# Patient Record
Sex: Male | Born: 1990 | Race: Black or African American | Hispanic: No | Marital: Single | Smoking: Current every day smoker
Health system: Southern US, Community
[De-identification: ages and names within clinical notes are randomized; demographics above are authoritative.]

## PROBLEM LIST (undated history)

## (undated) DIAGNOSIS — J45909 Unspecified asthma, uncomplicated: Secondary | ICD-10-CM

---
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
## 2010-03-30 ENCOUNTER — Ambulatory Visit (HOSPITAL_COMMUNITY): Admission: RE | Admit: 2010-03-30 | Discharge: 2010-03-30 | Payer: Self-pay | Admitting: Family Medicine

## 2011-10-02 IMAGING — CR DG CLAVICLE*R*
2 series · 2 of 2 positions shown · non-contrast
Comparison: None

CLINICAL DATA: Clavicular injury, pain

RIGHT CLAVICLE - 2+ VIEWS

[view not recorded (1 of 2)]
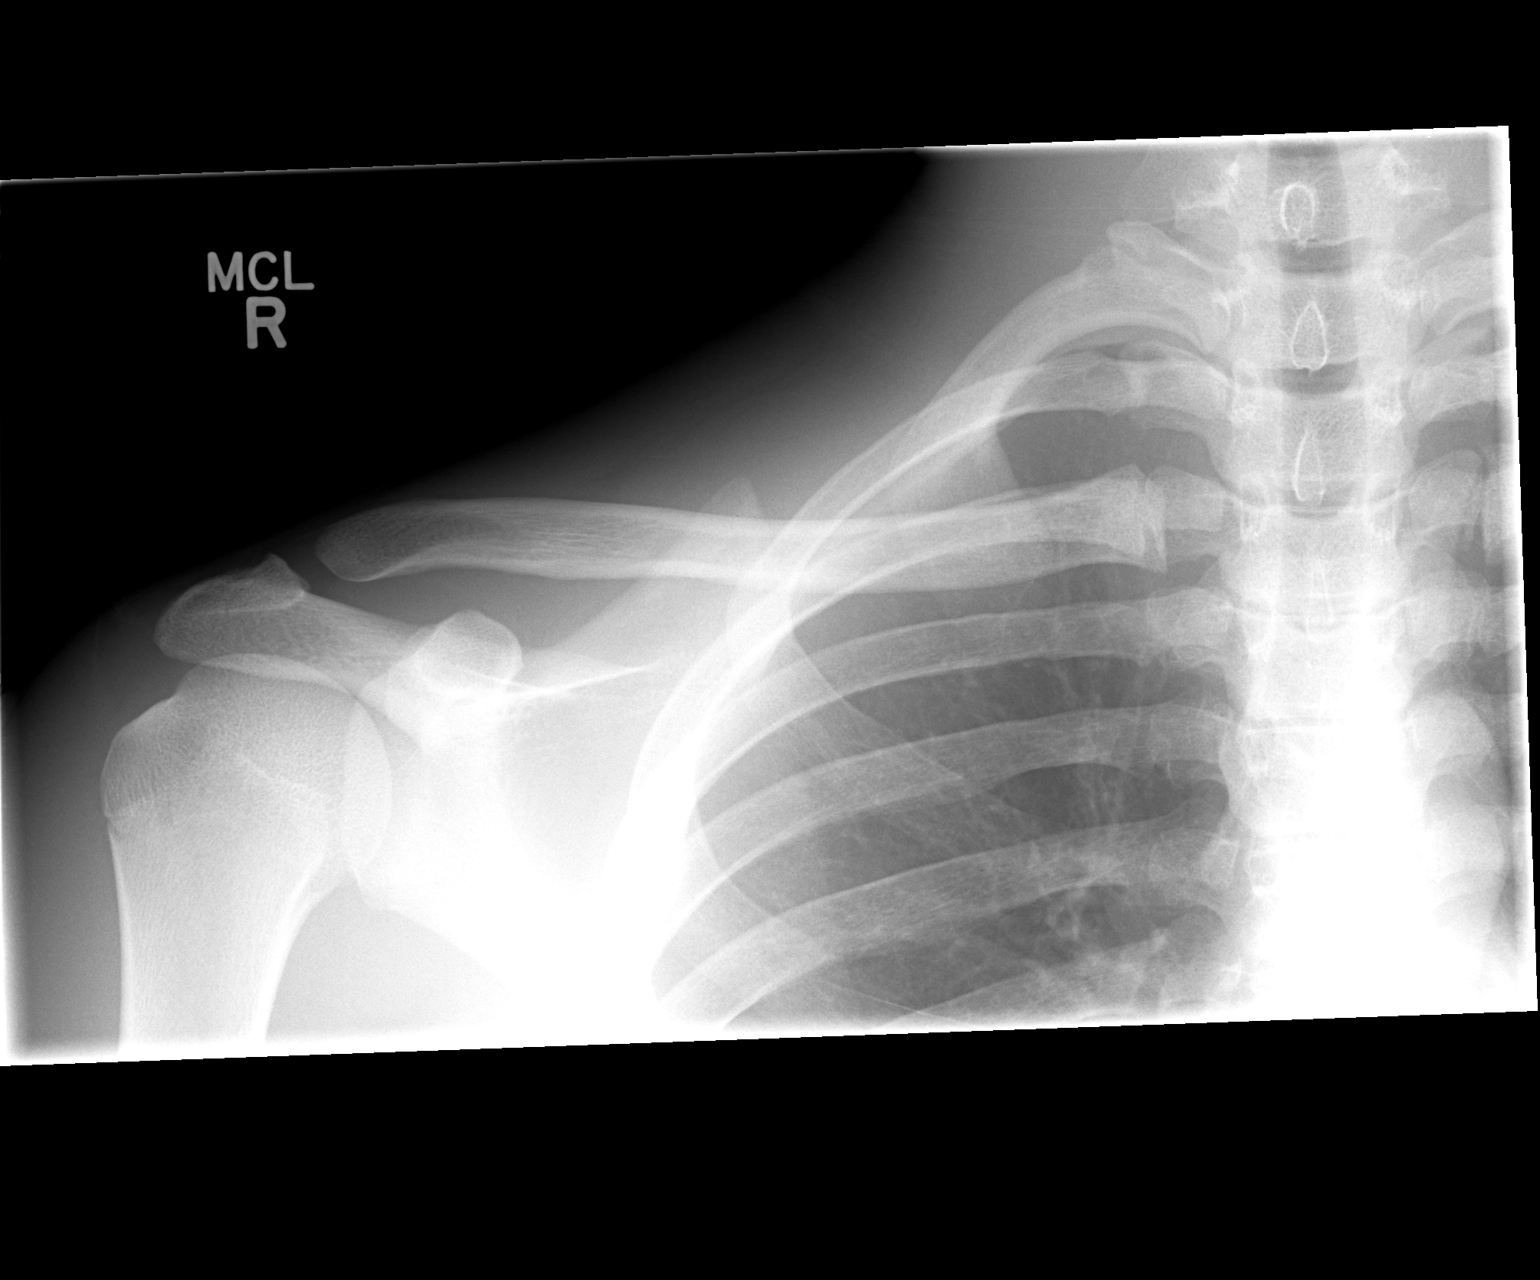

[view not recorded (2 of 2)]
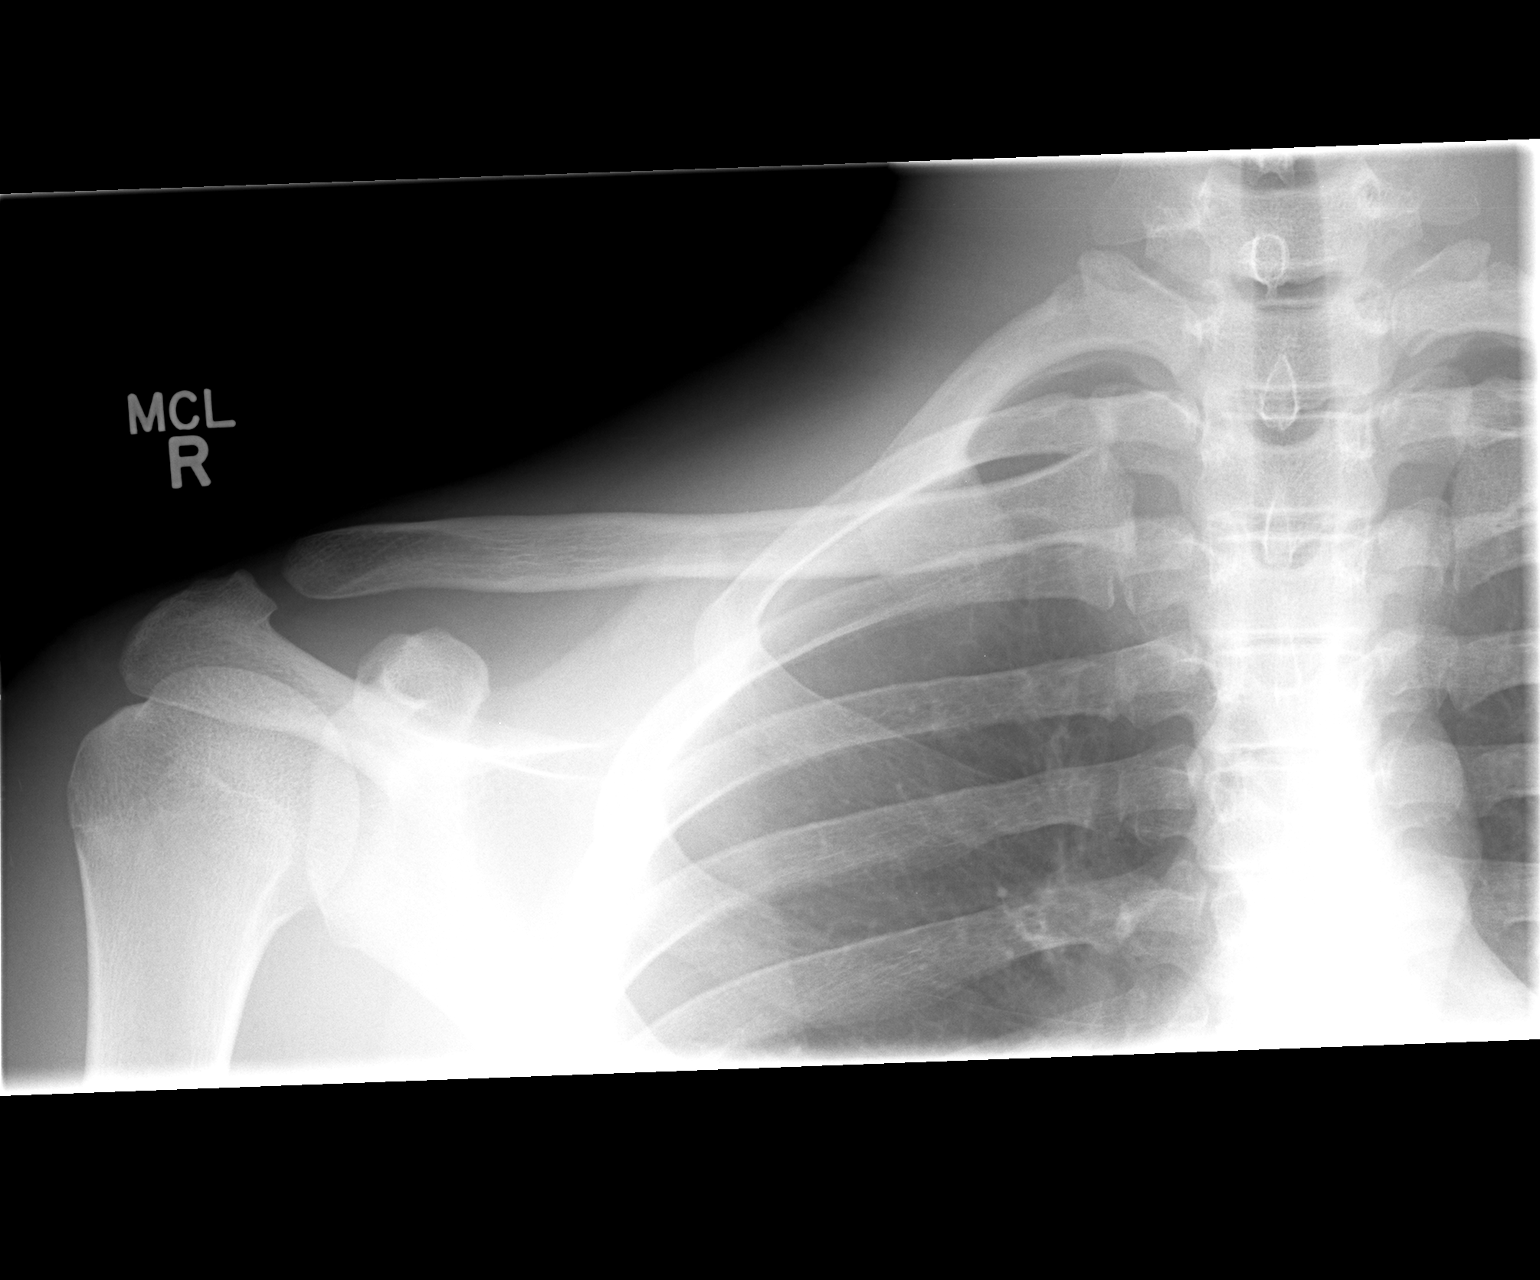

[2 of 2 positions shown; findings below may reference images not displayed]

FINDINGS: Sternoclavicular and acromioclavicular joint alignments normal.
Mineralization normal.
No acute fracture, dislocation, or bone destruction.
IMPRESSION: No acute right clavicular abnormalities.

## 2012-07-28 ENCOUNTER — Emergency Department (HOSPITAL_COMMUNITY)
Admission: EM | Admit: 2012-07-28 | Discharge: 2012-07-28 | Disposition: A | Discharge status: Home / Self Care | Payer: Self-pay | Attending: Emergency Medicine | Admitting: Emergency Medicine

## 2012-07-28 ENCOUNTER — Encounter (HOSPITAL_COMMUNITY): Payer: Self-pay

## 2012-07-28 DIAGNOSIS — R51 Headache: Secondary | ICD-10-CM | POA: Insufficient documentation

## 2012-07-28 DIAGNOSIS — B9789 Other viral agents as the cause of diseases classified elsewhere: Secondary | ICD-10-CM | POA: Insufficient documentation

## 2012-07-28 DIAGNOSIS — J45909 Unspecified asthma, uncomplicated: Secondary | ICD-10-CM | POA: Insufficient documentation

## 2012-07-28 DIAGNOSIS — B349 Viral infection, unspecified: Secondary | ICD-10-CM

## 2012-07-28 DIAGNOSIS — IMO0001 Reserved for inherently not codable concepts without codable children: Secondary | ICD-10-CM | POA: Insufficient documentation

## 2012-07-28 DIAGNOSIS — R6883 Chills (without fever): Secondary | ICD-10-CM | POA: Insufficient documentation

## 2012-07-28 HISTORY — DX: Unspecified asthma, uncomplicated: J45.909

## 2012-07-28 LAB — URINE MICROSCOPIC-ADD ON

## 2012-07-28 LAB — COMPREHENSIVE METABOLIC PANEL
ALT: 15 U/L (ref 0–53)
AST: 18 U/L (ref 0–37)
CO2: 31 mEq/L (ref 19–32)
Calcium: 9.3 mg/dL (ref 8.4–10.5)
GFR calc non Af Amer: 81 mL/min — ABNORMAL LOW (ref >90)
Sodium: 139 mEq/L (ref 135–145)
Total Protein: 7.4 g/dL (ref 6.0–8.3)

## 2012-07-28 LAB — URINALYSIS, ROUTINE W REFLEX MICROSCOPIC
Bilirubin Urine: NEGATIVE
Hgb urine dipstick: NEGATIVE
Specific Gravity, Urine: 1.025 (ref 1.005–1.030)
pH: 6 (ref 5.0–8.0)

## 2012-07-28 MED ORDER — ONDANSETRON HCL 4 MG PO TABS: 4.0000 mg | ORAL_TABLET | Freq: Four times a day (QID) | ORAL | Status: DC

## 2012-07-28 MED ORDER — SODIUM CHLORIDE 0.9 % IV SOLN
1000.0000 mL | INTRAVENOUS | Status: DC
  Administered 2012-07-28: 1000 mL via INTRAVENOUS

## 2012-07-28 MED ORDER — ONDANSETRON HCL 4 MG/2ML IJ SOLN
4.0000 mg | Freq: Once | INTRAMUSCULAR | Status: AC
  Administered 2012-07-28: 4 mg via INTRAVENOUS
  Filled 2012-07-28: qty 2

## 2012-07-28 MED ORDER — SODIUM CHLORIDE 0.9 % IV SOLN
1000.0000 mL | Freq: Once | INTRAVENOUS | Status: AC
  Administered 2012-07-28: 1000 mL via INTRAVENOUS

## 2012-07-28 NOTE — ED Provider Notes (Signed)
History     CSN: [id number]  Arrival date & time 07/28/12  4696   First MD Initiated Contact with Patient 07/28/12 1002      Chief Complaint  Patient presents with  . Emesis  . Headache    (Consider location/radiation/quality/duration/timing/severity/associated sxs/prior treatment) Patient is a 22 y.o. male presenting with vomiting and headaches. The history is provided by the patient.  Emesis Severity:  Moderate Duration:  4 days Timing:  Intermittent Quality:  Stomach contents Able to tolerate:  Liquids Progression:  Unchanged Chronicity:  New Recent urination:  Normal Relieved by:  Nothing Worsened by:  Nothing tried Ineffective treatments:  None tried Associated symptoms: chills, headaches and myalgias   Associated symptoms: no abdominal pain, no arthralgias and no fever   Risk factors: no alcohol use, no diabetes and no suspect food intake   Headache Associated symptoms: myalgias and vomiting   Associated symptoms: no abdominal pain, no back pain, no cough, no dizziness, no neck pain, no photophobia and no seizures     Past Medical History  Diagnosis Date  . Asthma     History reviewed. No pertinent past surgical history.  No family history on file.  History  Substance Use Topics  . Smoking status: Never Smoker   . Smokeless tobacco: Not on file  . Alcohol Use: No      Review of Systems  Constitutional: Positive for chills. Negative for activity change.       All ROS Neg except as noted in HPI  HENT: Negative for nosebleeds and neck pain.   Eyes: Negative for photophobia and discharge.  Respiratory: Negative for cough, shortness of breath and wheezing.   Cardiovascular: Negative for chest pain and palpitations.  Gastrointestinal: Positive for vomiting. Negative for abdominal pain and blood in stool.  Genitourinary: Negative for dysuria, frequency and hematuria.  Musculoskeletal: Positive for myalgias. Negative for back pain and arthralgias.  Skin:  Negative.   Neurological: Positive for headaches. Negative for dizziness, seizures and speech difficulty.  Psychiatric/Behavioral: Negative for hallucinations and confusion.    Allergies  Review of patient's allergies indicates no known allergies.  Home Medications  No current outpatient prescriptions on file.  BP 110/78  Pulse 71  Temp(Src) 97.8 F (36.6 C) (Oral)  Resp 16  Ht 5\' 10"  (1.778 m)  Wt 140 lb (63.504 kg)  BMI 20.09 kg/m2  SpO2 99%  Physical Exam  Nursing note and vitals reviewed. Constitutional: He is oriented to person, place, and time. He appears well-developed and well-nourished.  Non-toxic appearance.  HENT:  Head: Normocephalic.  Right Ear: Tympanic membrane and external ear normal.  Left Ear: Tympanic membrane and external ear normal.  Eyes: EOM and lids are normal. Pupils are equal, round, and reactive to light.  Neck: Normal range of motion. Neck supple. Carotid bruit is not present.  Cardiovascular: Normal rate, regular rhythm, normal heart sounds, intact distal pulses and normal pulses.   Pulmonary/Chest: Breath sounds normal. No respiratory distress.  Abdominal: Soft. Bowel sounds are normal. There is no tenderness. There is no guarding.  Musculoskeletal: Normal range of motion.  Lymphadenopathy:       Head (right side): No submandibular adenopathy present.       Head (left side): No submandibular adenopathy present.    He has no cervical adenopathy.  Neurological: He is alert and oriented to person, place, and time. He has normal strength. No cranial nerve deficit or sensory deficit. He exhibits normal muscle tone. Coordination normal.  Skin: Skin  is warm and dry.  Psychiatric: He has a normal mood and affect. His speech is normal.    ED Course  Procedures (including critical care time)  Labs Reviewed  COMPREHENSIVE METABOLIC PANEL - Abnormal; Notable for the following:    GFR calc non Af Amer 81 (*)    All other components within normal  limits  URINALYSIS, ROUTINE W REFLEX MICROSCOPIC - Abnormal; Notable for the following:    Protein, ur TRACE (*)    All other components within normal limits  LIPASE, BLOOD  URINE MICROSCOPIC-ADD ON   No results found.   No diagnosis found.    MDM  I have reviewed nursing notes, vital signs, and all appropriate lab and imaging results for this patient.   No vomiting noted while the patient is in the emergency department. The comprehensive metabolic panel was completely normal. The lipase is normal. The urinalysis is also normal. Suspect the patient has a viral illness with nausea vomiting. Plan the patient will be given a prescription for Zofran, and asked to increase fluids. The patient also strongly advised to wash hands frequently. He is to return if not improving       Kathie Dike, [place] 07/28/12 1219

## 2012-07-28 NOTE — ED Provider Notes (Signed)
Medical screening examination/treatment/procedure(s) were performed by non-physician practitioner and as supervising physician I was immediately available for consultation/collaboration.   Shamir Tuzzolino, MD 07/28/12 1551 

## 2012-07-28 NOTE — ED Notes (Signed)
Pt reports he is feeling better. No vomiting since he has been here.

## 2012-07-28 NOTE — ED Notes (Signed)
Pt c/o vomiting and headache since Friday.   Denies diarrhea, LBM was yesterday.  Denies abd pain.

## 2013-05-01 ENCOUNTER — Encounter (HOSPITAL_COMMUNITY): Payer: Self-pay | Admitting: Emergency Medicine

## 2013-05-01 ENCOUNTER — Emergency Department (HOSPITAL_COMMUNITY)
Admission: EM | Admit: 2013-05-01 | Discharge: 2013-05-01 | Disposition: A | Discharge status: Home / Self Care | Payer: Self-pay | Attending: Emergency Medicine | Admitting: Emergency Medicine

## 2013-05-01 DIAGNOSIS — F17200 Nicotine dependence, unspecified, uncomplicated: Secondary | ICD-10-CM | POA: Insufficient documentation

## 2013-05-01 DIAGNOSIS — R5381 Other malaise: Secondary | ICD-10-CM | POA: Insufficient documentation

## 2013-05-01 DIAGNOSIS — J45909 Unspecified asthma, uncomplicated: Secondary | ICD-10-CM | POA: Insufficient documentation

## 2013-05-01 DIAGNOSIS — E860 Dehydration: Secondary | ICD-10-CM | POA: Insufficient documentation

## 2013-05-01 DIAGNOSIS — M6282 Rhabdomyolysis: Secondary | ICD-10-CM | POA: Insufficient documentation

## 2013-05-01 DIAGNOSIS — R112 Nausea with vomiting, unspecified: Secondary | ICD-10-CM | POA: Insufficient documentation

## 2013-05-01 DIAGNOSIS — R34 Anuria and oliguria: Secondary | ICD-10-CM | POA: Insufficient documentation

## 2013-05-01 LAB — CBC WITH DIFFERENTIAL/PLATELET
HCT: 47.8 % (ref 39.0–52.0)
Hemoglobin: 15.8 g/dL (ref 13.0–17.0)
Lymphocytes Relative: 5 % — ABNORMAL LOW (ref 12–46)
Monocytes Absolute: 0.5 10*3/uL (ref 0.1–1.0)
Monocytes Relative: 4 % (ref 3–12)
Neutro Abs: 12.3 10*3/uL — ABNORMAL HIGH (ref 1.7–7.7)
RBC: 5.5 MIL/uL (ref 4.22–5.81)
WBC: 13.6 10*3/uL — ABNORMAL HIGH (ref 4.0–10.5)

## 2013-05-01 LAB — BASIC METABOLIC PANEL
BUN: 24 mg/dL — ABNORMAL HIGH (ref 6–23)
CO2: 23 mEq/L (ref 19–32)
Chloride: 98 mEq/L (ref 96–112)
Creatinine, Ser: 1.32 mg/dL (ref 0.50–1.35)

## 2013-05-01 LAB — URINALYSIS, ROUTINE W REFLEX MICROSCOPIC
Glucose, UA: NEGATIVE mg/dL
Ketones, ur: >80 mg/dL — AB
Leukocytes, UA: NEGATIVE
Specific Gravity, Urine: 1.025 (ref 1.005–1.030)
pH: 6 (ref 5.0–8.0)

## 2013-05-01 MED ORDER — ONDANSETRON HCL 4 MG/2ML IJ SOLN
4.0000 mg | Freq: Once | INTRAMUSCULAR | Status: AC
  Administered 2013-05-01: 4 mg via INTRAVENOUS
  Filled 2013-05-01: qty 2

## 2013-05-01 MED ORDER — SODIUM CHLORIDE 0.9 % IV SOLN
Freq: Once | INTRAVENOUS | Status: AC
  Administered 2013-05-01: 1000 mL via INTRAVENOUS

## 2013-05-01 MED ORDER — ONDANSETRON HCL 4 MG/2ML IJ SOLN: 4.0000 mg | Freq: Once | INTRAMUSCULAR | Status: DC

## 2013-05-01 MED ORDER — METOCLOPRAMIDE HCL 5 MG/ML IJ SOLN
10.0000 mg | Freq: Once | INTRAMUSCULAR | Status: AC
  Administered 2013-05-01: 10 mg via INTRAVENOUS
  Filled 2013-05-01: qty 2

## 2013-05-01 NOTE — ED Provider Notes (Signed)
CSN: [id number]     Arrival date & time 05/01/13  1839 History   First MD Initiated Contact with Patient 05/01/13 1912     Chief Complaint  Patient presents with  . Nausea   (Consider location/radiation/quality/duration/timing/severity/associated sxs/prior Treatment) HPI Comments: Fred Woodward is a 22 y.o. male who presents to the Emergency Department complaining of generalized weakness, leg cramps, nausea and vomiting that began this afternoon after playing football.  States that he believes he may have gotten dehydrated because he did not drink any fluids.  He later tried to drink some water and ginger ale , but vomited.  He denies alcohol or drug use, abdominal pain, chest pain or shortness of breath.  He states that nothing makes he symptoms better.    The history is provided by the patient.    Past Medical History  Diagnosis Date  . Asthma    History reviewed. No pertinent past surgical history. No family history on file. History  Substance Use Topics  . Smoking status: Current Every Day Smoker    Types: Cigarettes  . Smokeless tobacco: Not on file  . Alcohol Use: Yes     Comment: 04/12/13    Review of Systems  Constitutional: Negative for fever, chills, activity change and appetite change.  HENT: Negative for trouble swallowing.   Eyes: Negative for visual disturbance.  Respiratory: Negative for cough, chest tightness and shortness of breath.   Cardiovascular: Negative for chest pain.  Gastrointestinal: Positive for nausea and vomiting. Negative for abdominal pain and constipation.  Genitourinary: Positive for decreased urine volume. Negative for dysuria, flank pain and difficulty urinating.  Musculoskeletal: Positive for myalgias. Negative for arthralgias and joint swelling.       Cramping of his upper and lower legs  Skin: Negative for color change and wound.  Neurological: Positive for weakness. Negative for dizziness, syncope, speech difficulty, light-headedness,  numbness and headaches.  Hematological: Negative for adenopathy.  Psychiatric/Behavioral: Negative for confusion.  All other systems reviewed and are negative.    Allergies  Review of patient's allergies indicates no known allergies.  Home Medications  No current outpatient prescriptions on file. BP 154/88  Pulse 51  Temp(Src) 97.2 F (36.2 C) (Oral)  Resp 18  Ht 5\' 10"  (1.778 m)  Wt 125 lb 7 oz (56.898 kg)  BMI 18.00 kg/m2  SpO2 100% Physical Exam  Nursing note and vitals reviewed. Constitutional: He is oriented to person, place, and time. He appears well-developed and well-nourished. No distress.  HENT:  Head: Normocephalic and atraumatic.  Mouth/Throat: Uvula is midline and oropharynx is clear and moist. Mucous membranes are dry.  Eyes: Conjunctivae and EOM are normal. Pupils are equal, round, and reactive to light.  Neck: Normal range of motion. Neck supple.  Cardiovascular: Normal rate, normal heart sounds and intact distal pulses.   No murmur heard. Pulmonary/Chest: Effort normal and breath sounds normal. No respiratory distress. He has no wheezes. He has no rales. He exhibits no tenderness.  Abdominal: Soft. He exhibits no distension and no mass. There is no tenderness. There is no guarding.  Musculoskeletal: Normal range of motion. He exhibits no edema and no tenderness.  Neurological: He is alert and oriented to person, place, and time. No sensory deficit. He exhibits normal muscle tone. Coordination normal.  Skin: Skin is warm and dry. No rash noted.  Psychiatric: He has a normal mood and affect. His behavior is normal.    ED Course  Procedures (including critical care time) Labs Review  Labs Reviewed  CBC WITH DIFFERENTIAL - Abnormal; Notable for the following:    WBC 13.6 (*)    Platelets 145 (*)    Neutrophils Relative % 91 (*)    Neutro Abs 12.3 (*)    Lymphocytes Relative 5 (*)    All other components within normal limits  BASIC METABOLIC PANEL -  Abnormal; Notable for the following:    Glucose, Bld 107 (*)    BUN 24 (*)    Calcium 11.2 (*)    GFR calc non Af Amer 75 (*)    GFR calc Af Amer 87 (*)    All other components within normal limits  URINALYSIS, ROUTINE W REFLEX MICROSCOPIC - Abnormal; Notable for the following:    Ketones, ur >80 (*)    All other components within normal limits  CK - Abnormal; Notable for the following:    Total CK 933 (*)    All other components within normal limits   Imaging Review No results found.  EKG Interpretation   None       MDM  Patient is watching TV, appears mildly uncomfortable.  Non-toxic appearing.    Patient with probable dehydration and early rhabdomyolysis.  Will give IV fluids, zofran and check labs.    Labs reviewed and discussed with EDP.  Will repeat liter of fluid and discuss possible admission with patient.    2200 patient is feeling better, has urinated, ambulated to restroom, drank fluids and received 2L IVF's and states he is feeling much better and requesting discharge.  I have discussed admission, but patient prefers to go home, drink fluids and return here tomorrow for recheck of labs.  Patient's father also at bedside and states that he will bring him back tomorrow.  I have discussed the importance of close recheck and risks involved if he does not drink adequate fluids.  Pt verbalizes understanding and agrees to return tomorrow or sooner if needed.    Dorothia Passmore L. Trisha Mangle, PA-C 05/01/13 2302

## 2013-05-01 NOTE — ED Notes (Signed)
Pt is unable to urinate at this  time requesting something else to drink. Fred Woodward

## 2013-05-01 NOTE — ED Notes (Signed)
Pt stated that he was playing football today and thinks he may have gotten dehydrated. Is weak  And having nausea. Legs are cramping.

## 2013-05-02 NOTE — ED Provider Notes (Signed)
Medical screening examination/treatment/procedure(s) were performed by non-physician practitioner and as supervising physician I was immediately available for consultation/collaboration.  EKG Interpretation   None        Suki Crockett R. Cloma Rahrig, MD 05/02/13 2359 

## 2013-07-28 ENCOUNTER — Emergency Department (HOSPITAL_COMMUNITY)
Admission: EM | Admit: 2013-07-28 | Discharge: 2013-07-28 | Disposition: A | Discharge status: Home / Self Care | Payer: Self-pay | Attending: Emergency Medicine | Admitting: Emergency Medicine

## 2013-07-28 ENCOUNTER — Encounter (HOSPITAL_COMMUNITY): Payer: Self-pay | Admitting: Emergency Medicine

## 2013-07-28 DIAGNOSIS — F17200 Nicotine dependence, unspecified, uncomplicated: Secondary | ICD-10-CM | POA: Insufficient documentation

## 2013-07-28 DIAGNOSIS — J45909 Unspecified asthma, uncomplicated: Secondary | ICD-10-CM | POA: Insufficient documentation

## 2013-07-28 DIAGNOSIS — R112 Nausea with vomiting, unspecified: Secondary | ICD-10-CM | POA: Insufficient documentation

## 2013-07-28 LAB — CBC WITH DIFFERENTIAL/PLATELET
BASOS ABS: 0 10*3/uL (ref 0.0–0.1)
Basophils Relative: 1 % (ref 0–1)
EOS ABS: 0 10*3/uL (ref 0.0–0.7)
Eosinophils Relative: 0 % (ref 0–5)
HEMATOCRIT: 44.9 % (ref 39.0–52.0)
Hemoglobin: 14.8 g/dL (ref 13.0–17.0)
LYMPHS ABS: 0.7 10*3/uL (ref 0.7–4.0)
Lymphocytes Relative: 22 % (ref 12–46)
MCH: 28.5 pg (ref 26.0–34.0)
MCHC: 33 g/dL (ref 30.0–36.0)
MCV: 86.5 fL (ref 78.0–100.0)
Monocytes Absolute: 0.9 10*3/uL (ref 0.1–1.0)
Monocytes Relative: 25 % — ABNORMAL HIGH (ref 3–12)
NEUTROS ABS: 1.8 10*3/uL (ref 1.7–7.7)
Neutrophils Relative %: 52 % (ref 43–77)
PLATELETS: 110 10*3/uL — AB (ref 150–400)
RBC: 5.19 MIL/uL (ref 4.22–5.81)
RDW: 12.3 % (ref 11.5–15.5)
WBC: 3.4 10*3/uL — ABNORMAL LOW (ref 4.0–10.5)

## 2013-07-28 LAB — COMPREHENSIVE METABOLIC PANEL
ALBUMIN: 4.6 g/dL (ref 3.5–5.2)
ALT: 29 U/L (ref 0–53)
AST: 28 U/L (ref 0–37)
Alkaline Phosphatase: 64 U/L (ref 39–117)
BUN: 21 mg/dL (ref 6–23)
CALCIUM: 9.9 mg/dL (ref 8.4–10.5)
CO2: 30 mEq/L (ref 19–32)
CREATININE: 1.22 mg/dL (ref 0.50–1.35)
Chloride: 98 mEq/L (ref 96–112)
GFR calc Af Amer: >90 mL/min (ref >90)
GFR calc non Af Amer: 83 mL/min — ABNORMAL LOW (ref >90)
Glucose, Bld: 102 mg/dL — ABNORMAL HIGH (ref 70–99)
Potassium: 3.9 mEq/L (ref 3.7–5.3)
Sodium: 141 mEq/L (ref 137–147)
TOTAL PROTEIN: 8.6 g/dL — AB (ref 6.0–8.3)
Total Bilirubin: 0.5 mg/dL (ref 0.3–1.2)

## 2013-07-28 LAB — LIPASE, BLOOD: LIPASE: 21 U/L (ref 11–59)

## 2013-07-28 MED ORDER — ONDANSETRON HCL 4 MG PO TABS: 4.0000 mg | ORAL_TABLET | Freq: Three times a day (TID) | ORAL | Status: DC | PRN

## 2013-07-28 MED ORDER — ONDANSETRON 8 MG PO TBDP
8.0000 mg | ORAL_TABLET | Freq: Once | ORAL | Status: AC
  Administered 2013-07-28: 8 mg via ORAL
  Filled 2013-07-28: qty 1

## 2013-07-28 NOTE — ED Notes (Signed)
Pt called to come back to pick up Rx. Left at front desk for pt

## 2013-07-28 NOTE — ED Notes (Signed)
Pt states his mother and brother has a virus. Complain of vomiting. States last time was about two hours ago. Denies other symptoms

## 2013-07-28 NOTE — Discharge Instructions (Signed)
°Emergency Department Resource Guide °1) Find a Doctor and Pay Out of Pocket °Although you won't have to find out who is covered by your insurance plan, it is a good idea to ask around and get recommendations. You will then need to call the office and see if the doctor you have chosen will accept you as a new patient and what types of options they offer for patients who are self-pay. Some doctors offer discounts or will set up payment plans for their patients who do not have insurance, but you will need to ask so you aren't surprised when you get to your appointment. ° °2) Contact Your Local Health Department °Not all health departments have doctors that can see patients for sick visits, but many do, so it is worth a call to see if yours does. If you don't know where your local health department is, you can check in your phone book. The CDC also has a tool to help you locate your state's health department, and many state websites also have listings of all of their local health departments. ° °3) Find a Walk-in Clinic °If your illness is not likely to be very severe or complicated, you may want to try a walk in clinic. These are popping up all over the country in pharmacies, drugstores, and shopping centers. They're usually staffed by nurse practitioners or physician assistants that have been trained to treat common illnesses and complaints. They're usually fairly quick and inexpensive. However, if you have serious medical issues or chronic medical problems, these are probably not your best option. ° °No Primary Care Doctor: °- Call [facility] Connect at  832-8000 - they can help you locate a primary care doctor that  accepts your insurance, provides certain services, etc. °- Physician Referral Service- 1-800-533-3463 ° °Chronic Pain Problems: °Organization         Address  Phone   Notes  °[facility]  (336) 297-2271 Patients need to be referred by their primary care doctor.  ° °Medication  Assistance: °Organization         Address  Phone   Notes  °[place] [facility] [address] °[place], [place] [address] (336) 641-8030 --Must be a resident of [place] °-- Must have NO insurance coverage whatsoever (no Medicaid/ Medicare, etc.) °-- The pt. MUST have a primary care doctor that directs their care regularly and follows them in the community °  °[facility]  (866) 331-1348   °[employer]  (888) 892-1162   ° °Agencies that provide inexpensive medical care: °Organization         Address  Phone   Notes  °[facility]  (336) 832-8035   °[facility]    (336) 832-7272   °[facility] [address] °[place], [place] [address] (336) 832-4777   °[facility] [address], °[place] (336) 271-4999   °[facility]    (336) 373-0678   °[facility]    (336) 272-1050   °[facility] ° [address], [place] Phone:  (336) 832-4444, Fax:  (336) 832-4440 Hours of Operation:  9 am - 6 pm, M-F.  Also accepts Medicaid/Medicare and self-pay.  °[facility] ° [address], [place] Phone: (336) 832-3150, Fax: (336) [phone]. Hours of Operation:  8:30 am - 5:30 pm, M-F.  Also accepts Medicaid and self-pay.  °[facility] [place] [address], [place] Phone: (336) 878-6027   °[facility] [address], [place], [place] (336)723-1848, Ext. 123 Mondays & Thursdays: 7-9 AM.  First 15 patients are seen on a first come, first serve basis. °  ° °Medicaid-accepting [place] Providers: ° °Organization         Address  Phone   Notes  °[facility] [address], [place] (336) 641-2100 Also accepts self-pay patients.  °[facility] [address], [place] ° (336) 856-9996   °[facility] [address], [place]  (336) 288-8857   °[facility] [address] [place] [address], [place] (336) 299-7000   °Veita Bland [address], [place]  ° (336) 373-1557 Only accepts [place] Access Medicaid patients after they have their name applied to their card.  ° °Self-Pay (no insurance) in [place]: ° °Organization         Address  Phone   Notes  °Sickle Cell Patients, [facility] [address], [place] (336) 832-1970   °[facility] [address], [place] (336) 832-4400   °[facility] ° [address], [place] (336) 992-4800   °[facility]/Dr. Osei-Bonsu ° [address] [place] [address], [place] or [address], [place] (336) [phone] Phone number for both [place] and [place] locations is the same.  °[facility] [address], [place] (336) 299-0000   °[facility] [address] [place] [address], [place] or [address] (336) 852-7530 °(336) 878-2260   °[facility] [address], [place] (336) 350-1642, phone; (336) 294-5005, fax Sees patients 1st and 3rd Saturday of every month.  Must not qualify for public or private insurance (i.e. Medicaid, Medicare, [place] Health Choice, Veterans' Benefits) • Household income should be no more than 200% of the poverty level •The clinic cannot treat you if you are pregnant or think you are pregnant • Sexually transmitted diseases are not treated at the clinic.  ° ° °Dental Care: °Organization         Address  Phone  Notes  °[place] [facility] [address], [place] (336) 641-6152 Accepts children up to age 21 who are enrolled in Medicaid or [place] Health Choice; pregnant women with a Medicaid card; and children who have applied for Medicaid or [place] Health Choice, but were declined, whose parents can pay a reduced fee at time of service.  °[place]  [facility] [place]  [address], [place] (336) 641-7733 Accepts children up to age 21 who are enrolled in Medicaid or [place] Health Choice; pregnant women with a Medicaid card; and children who have applied for Medicaid or [place] Health Choice, but were declined, whose parents can pay a reduced fee at time of service.  °[facility] ° [address], [place] (336) 641-4533 Patients are seen by appointment only. Walk-ins are not accepted. [facility] will see patients 18 years of age and older. °Monday - Tuesday (8am-5pm) °Most Wednesdays (8:30-5pm) °$30 per visit, cash only  °[facility] ° [address], [place] (336) 641-4533 Patients are seen by appointment only. Walk-ins are not accepted. [facility] will see patients 18 years of age and older. °One   Wednesday Evening (Monthly: Volunteer Based).  $30 per visit, cash only  °[facility]  (919) 537-3737 for adults; Children under age 4, call Graduate Pediatric Dentistry at ([phone]) [phone]. Children aged 4-14, please call (919) [phone] to request a pediatric application. ° Dental services are provided in all areas of dental care including fillings, crowns and bridges, complete and partial dentures, implants, gum treatment, root canals, and extractions. Preventive care is also provided. Treatment is provided to both adults and children. °Patients are selected via a lottery and there is often a waiting list. °  °[facility] [address], °[place] ° (336) 763-8833 [email or web address] °  °[facility] [address], [place], [place] (336)723-1848, Ext. 123 Second and Fourth Thursday of each month, opens at 6:30 AM; Clinic ends at 9 AM.  Patients are seen on a first-come first-served basis, and a limited number are seen during each clinic.  ° °[facility] ° [address], [place], [place] (336) 723-7904    Eligibility Requirements °You must have lived in [place], [place], or [place] counties for at least the last three months. °  You cannot be eligible for state or federal sponsored healthcare insurance, including Veterans Administration, Medicaid, or Medicare. °  You generally cannot be eligible for healthcare insurance through your employer.  °  How to apply: °Eligibility screenings are held every Tuesday and Wednesday afternoon from 1:00 pm until 4:00 pm. You do not need an appointment for the interview!  °[facility] [address], [place], [place] 336-631-2330   °[facility]  336-342-8273   °[facility]  336-703-3100   °[facility]  336-570-6415   ° °Behavioral Health Resources in the Community: °Intensive Outpatient Programs °Organization         Address  Phone  Notes  °[facility] [address], [place], [place] 336-878-6098   °[facility] [address], [place], [place] 336-832-9800   °[facility] [address], [place], [place] ° 336-882-2125   °[facility] [address],  °[place], [place] 1-800-853-5163 or 336-641-4981   °Substance Abuse Resources °Organization         Address  Phone  Notes  °[facility]  336-882-2125   °[facility]  336-784-9470   °[facility]  336-285-9073   °[facility]  336-845-3988   °Residential & Outpatient Substance Abuse Program  1-800-659-3381   °Psychological Services °Organization         Address  Phone  Notes  °[facility]  336- 832-9600   °[facility]  336- 378-7881   °[facility] [address], [place] 1-800-853-5163 or 336-641-4981   ° °Mobile Crisis Teams °Organization         Address  Phone  Notes  °[facility], Mobile Crisis Care Unit  1-877-626-1772   °Assertive °[facility] ° [address]  [place], [place] 336-834-9664   °Sharon DeEsch [address] °[place] 336-554-5454   ° °Self-Help/Support Groups °Organization         Address  Phone             Notes  °[facility] of  - variety of support groups  336- 373-1402 Call for more information  °Narcotics Anonymous (NA), [facility] [address], °[place]  2 meetings at this location  ° °  Residential Treatment Programs Organization         Address  Phone  Notes  [facility] [address],    [place]  [phone]   [facility]  [address], [place] [phone], [place], [place] [phone]   [facility] [address], [place] [phone] Admissions: 8am-3pm M-F  [facility] [address]    [place], [place] [phone]   [facility] [address], [place], [place] [phone]   [facility] [address]  [place], [place] [phone]   [facility] Intensive Outpatient [address], [place], [place] [phone]) [phone]   [facility] ([facility] [address]  [place], [place] [phone])[phone] or [phone]   [facility] ([facility] [address] [place], [place] [phone](680)[phone] Accepts Medicaid  [facility] [address]  [place] [phone] Substance Abuse/Addiction Treatment   [facility]         Address  Phone  Notes  [facility]  720-079-8372(888) [phone]   Angie FavaJulie Brannon, PhD [address], [address] [place], [place]   (719) [phone](336) [phone] or [phone](336) (989) [phone]   [facility]   [address] [place], [place] [phone](336) (718)[phone]   [facility] [address], [place], [place] [phone](336) [phone] Insurance/Medicaid/sponsorship through [facility] [address]                                    [place], [place] [phone](336) [phone] Therapy/tele-psych/case    [facility] [address]   [place], [place] [phone](336) [phone]    Dr. Lolly MustacheArfeen  [phone](336) [phone]   [facility]  [employer] [facility] 1) [address], [place] 2) [address], [place] 3)  [address], [place] [phone](336) [phone](336) ([phone]  ([phone](336) [phone]   [facility] Child Abuse Hot[facility] 479-256-0771(336) (705) 582-1663 or [phone](336) ([phone] (After Hours)       Take the prescription as directed.  Increase your fluid intake (ie:  Gatoraide) for the next few days, as discussed.  Eat a bland diet and advance to your regular diet slowly as you can tolerate it.  Call your regular medical doctor today to schedule a follow up appointment this week.  Return to the Emergency Department immediately if not improving (or even worsening) despite taking the medicines as prescribed, any black or bloody stool or vomit, if you develop a fever over "101," or for any other concerns.

## 2013-07-28 NOTE — ED Provider Notes (Signed)
CSN: [id number]     Arrival date & time 07/28/13  1154 History   First MD Initiated Contact with Patient 07/28/13 1221     Chief Complaint  Patient presents with  . Emesis     HPI Pt was seen at 1220. Per pt, c/o gradual onset and persistence of multiple intermittent episodes of N/V that began 2 days ago.   Describes the stools as "loose." States "the whole house has this."  Denies diarrhea, no abd pain, no CP/SOB, no back pain, no fevers, no black or blood in stools or emesis.     Past Medical History  Diagnosis Date  . Asthma    History reviewed. No pertinent past surgical history.   History  Substance Use Topics  . Smoking status: Current Every Day Smoker    Types: Cigarettes  . Smokeless tobacco: Not on file  . Alcohol Use: Yes     Comment: 04/12/13    Review of Systems ROS: Statement: All systems negative except as marked or noted in the HPI; Constitutional: Negative for fever and chills. ; ; Eyes: Negative for eye pain, redness and discharge. ; ; ENMT: Negative for ear pain, hoarseness, nasal congestion, sinus pressure and sore throat. ; ; Cardiovascular: Negative for chest pain, palpitations, diaphoresis, dyspnea and peripheral edema. ; ; Respiratory: Negative for cough, wheezing and stridor. ; ; Gastrointestinal: +N/V. Negative for diarrhea, abdominal pain, blood in stool, hematemesis, jaundice and rectal bleeding. . ; ; Genitourinary: Negative for dysuria, flank pain and hematuria. ; ; Musculoskeletal: Negative for back pain and neck pain. Negative for swelling and trauma.; ; Skin: Negative for pruritus, rash, abrasions, blisters, bruising and skin lesion.; ; Neuro: Negative for headache, lightheadedness and neck stiffness. Negative for weakness, altered level of consciousness , altered mental status, extremity weakness, paresthesias, involuntary movement, seizure and syncope.     Allergies  Review of patient's allergies indicates no known allergies.  Home Medications   No current outpatient prescriptions on file. BP 134/88  Pulse 66  Temp(Src) 98.1 F (36.7 C) (Oral)  Resp 18  Ht 5\' 10"  (1.778 m)  Wt 123 lb 1 oz (55.821 kg)  BMI 17.66 kg/m2  SpO2 98% Physical Exam 1225: Physical examination:  Nursing notes reviewed; Vital signs and O2 SAT reviewed;  Constitutional: Well developed, Well nourished, Well hydrated, In no acute distress; Head:  Normocephalic, atraumatic; Eyes: EOMI, PERRL, No scleral icterus; ENMT: Mouth and pharynx normal, Mucous membranes moist; Neck: Supple, Full range of motion, No lymphadenopathy; Cardiovascular: Regular rate and rhythm, No murmur, rub, or gallop; Respiratory: Breath sounds clear & equal bilaterally, No rales, rhonchi, wheezes.  Speaking full sentences with ease, Normal respiratory effort/excursion; Chest: Nontender, Movement normal; Abdomen: Soft, Nontender, Nondistended, Normal bowel sounds; Genitourinary: No CVA tenderness; Extremities: Pulses normal, No tenderness, No edema, No calf edema or asymmetry.; Neuro: AA&Ox3, Major CN grossly intact.  Speech clear. No gross focal motor or sensory deficits in extremities. Climbs on and off stretcher easily by himself. Gait steady.; Skin: Color normal, Warm, Dry.   ED Course  Procedures  EKG Interpretation   None       MDM  MDM Reviewed: previous chart, nursing note and vitals Reviewed previous: labs Interpretation: labs    Results for orders placed during the hospital encounter of 07/28/13  CBC WITH DIFFERENTIAL      Result Value Ref Range   WBC 3.4 (*) 4.0 - 10.5 K/uL   RBC 5.19  4.22 - 5.81 MIL/uL   Hemoglobin 14.8  13.0 - 17.0 g/dL   HCT 16.144.9  09.639.0 - 04.552.0 %   MCV 86.5  78.0 - 100.0 fL   MCH 28.5  26.0 - 34.0 pg   MCHC 33.0  30.0 - 36.0 g/dL   RDW 40.912.3  81.111.5 - 91.415.5 %   Platelets 110 (*) 150 - 400 K/uL   Neutrophils Relative % 52  43 - 77 %   Lymphocytes Relative 22  12 - 46 %   Monocytes Relative 25 (*) 3 - 12 %   Eosinophils Relative 0  0 - 5 %    Basophils Relative 1  0 - 1 %   Neutro Abs 1.8  1.7 - 7.7 K/uL   Lymphs Abs 0.7  0.7 - 4.0 K/uL   Monocytes Absolute 0.9  0.1 - 1.0 K/uL   Eosinophils Absolute 0.0  0.0 - 0.7 K/uL   Basophils Absolute 0.0  0.0 - 0.1 K/uL   Smear Review PLATELET COUNT CONFIRMED BY SMEAR    COMPREHENSIVE METABOLIC PANEL      Result Value Ref Range   Sodium 141  137 - 147 mEq/L   Potassium 3.9  3.7 - 5.3 mEq/L   Chloride 98  96 - 112 mEq/L   CO2 30  19 - 32 mEq/L   Glucose, Bld 102 (*) 70 - 99 mg/dL   BUN 21  6 - 23 mg/dL   Creatinine, Ser 7.821.22  0.50 - 1.35 mg/dL   Calcium 9.9  8.4 - 95.610.5 mg/dL   Total Protein 8.6 (*) 6.0 - 8.3 g/dL   Albumin 4.6  3.5 - 5.2 g/dL   AST 28  0 - 37 U/L   ALT 29  0 - 53 U/L   Alkaline Phosphatase 64  39 - 117 U/L   Total Bilirubin 0.5  0.3 - 1.2 mg/dL   GFR calc non Af Amer 83 (*) >90 mL/min   GFR calc Af Amer >90  >90 mL/min  LIPASE, BLOOD      Result Value Ref Range   Lipase 21  11 - 59 U/L     1445:  Pt has tol PO well while in the ED without N/V.  No stooling while in the ED.  Abd remains benign, VSS. Feels better and wants to go home now. Dx and testing d/w pt.  Questions answered.  Verb understanding, agreeable to d/c home with outpt f/u.      Laray AngerKathleen M Justine Dines, DO 07/30/13 2228

## 2013-07-28 NOTE — ED Notes (Signed)
Vomiting for 2 days.  Denies any other symptoms.

## 2013-07-28 NOTE — ED Notes (Signed)
In to discharge pt. Pt not in his room

## 2013-07-28 NOTE — ED Notes (Signed)
Pt request another sprite. given

## 2013-09-22 ENCOUNTER — Encounter (HOSPITAL_COMMUNITY): Payer: Self-pay | Admitting: Emergency Medicine

## 2013-09-22 ENCOUNTER — Emergency Department (HOSPITAL_COMMUNITY)
Admission: EM | Admit: 2013-09-22 | Discharge: 2013-09-22 | Disposition: A | Discharge status: Home / Self Care | Payer: Self-pay | Attending: Emergency Medicine | Admitting: Emergency Medicine

## 2013-09-22 DIAGNOSIS — F17200 Nicotine dependence, unspecified, uncomplicated: Secondary | ICD-10-CM | POA: Insufficient documentation

## 2013-09-22 DIAGNOSIS — Z0000 Encounter for general adult medical examination without abnormal findings: Secondary | ICD-10-CM

## 2013-09-22 DIAGNOSIS — Z87898 Personal history of other specified conditions: Secondary | ICD-10-CM

## 2013-09-22 DIAGNOSIS — Z008 Encounter for other general examination: Secondary | ICD-10-CM | POA: Insufficient documentation

## 2013-09-22 DIAGNOSIS — R61 Generalized hyperhidrosis: Secondary | ICD-10-CM | POA: Insufficient documentation

## 2013-09-22 DIAGNOSIS — R42 Dizziness and giddiness: Secondary | ICD-10-CM | POA: Insufficient documentation

## 2013-09-22 DIAGNOSIS — J45909 Unspecified asthma, uncomplicated: Secondary | ICD-10-CM | POA: Insufficient documentation

## 2013-09-22 LAB — URINALYSIS, DIPSTICK ONLY
Bilirubin Urine: NEGATIVE
Glucose, UA: NEGATIVE mg/dL
Hgb urine dipstick: NEGATIVE
Ketones, ur: NEGATIVE mg/dL
Leukocytes, UA: NEGATIVE
Nitrite: NEGATIVE
PROTEIN: NEGATIVE mg/dL
Specific Gravity, Urine: 1.025 (ref 1.005–1.030)
Urobilinogen, UA: 1 mg/dL (ref 0.0–1.0)
pH: 6 (ref 5.0–8.0)

## 2013-09-22 NOTE — ED Provider Notes (Signed)
  Medical screening examination/treatment/procedure(s) were performed by non-physician practitioner and as supervising physician I was immediately available for consultation/collaboration.   EKG Interpretation None         Gerhard Munchobert Heena [place], MD 09/22/13 1431

## 2013-09-22 NOTE — ED Notes (Signed)
Pt states he became diaphoretic, dizzy and lightheaded while at work on Monday. Pt states symptoms last 2-3 hours. Denies symptoms since.

## 2013-09-22 NOTE — Care Management Note (Signed)
ED/CM noted patient did not have health insurance and/or PCP listed in the computer.  Patient was given the [place] resource handout with information on the clinics, food pantries, and the handout for new health insurance sign-up.  Patient expressed appreciation for information received. 

## 2013-09-22 NOTE — Discharge Instructions (Signed)
Your exam is reassuring today. I suspect your symptoms were related to the hot environment you were working in when the symptoms started.  Make sure you are staying hydrated while you work.  Get rechecked if you develop any return or worsening symptoms.

## 2013-09-22 NOTE — ED Provider Notes (Signed)
CSN: [id number]     Arrival date & time 09/22/13  [id number] History   First MD Initiated Contact with Patient 09/22/13 [phone]     Chief Complaint  Patient presents with  . Dizziness     (Consider location/radiation/quality/duration/timing/severity/associated sxs/prior Treatment) HPI Comments: Fred Woodward is a 23 y.o. Male presenting for evaluation of dizziness and diaphoresis which occurred about 7 hours into his shift 2 nights ago.  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  There are fans in the room he works, but they just blow hot air.  He reports having an approximate 2 hour episode of feeling weak and lightheaded toward the end of his shift.  He reports he became increasingly diaphoretic during the shift. He had 3 breaks during his shift during which he ate a snack and drank fluids and denies any decrease in urinary frequency during or after the shift.  He also denies chest pain, sob, palpitations, nausea or headache.  He did not work yesterday and has been symptom free, eating and drinking normally.  He wanted to get checked out before he returned to work tonight.     The history is provided by the patient.    Past Medical History  Diagnosis Date  . Asthma    History reviewed. No pertinent past surgical history. History reviewed. No pertinent family history. History  Substance Use Topics  . Smoking status: Current Every Day Smoker    Types: Cigarettes  . Smokeless tobacco: Not on file  . Alcohol Use: Yes     Comment: 04/12/13    Review of Systems  Constitutional: Positive for diaphoresis. Negative for fever.  HENT: Negative for congestion and sore throat.   Eyes: Negative.   Respiratory: Negative for chest tightness and shortness of breath.   Cardiovascular: Negative for chest pain and palpitations.  Gastrointestinal: Negative for nausea and abdominal pain.  Genitourinary: Negative.    Musculoskeletal: Negative for arthralgias, joint swelling and neck pain.  Skin: Negative.  Negative for color change, rash and wound.  Neurological: Positive for light-headedness. Negative for dizziness, weakness, numbness and headaches.  Psychiatric/Behavioral: Negative.       Allergies  Review of patient's allergies indicates no known allergies.  Home Medications   Prior to Admission medications   Not on File   BP 135/71  Pulse 96  Temp(Src) 98.2 F (36.8 C) (Oral)  Resp 18  SpO2 98% Physical Exam  Nursing note and vitals reviewed. Constitutional: He appears well-developed and well-nourished. No distress.  HENT:  Head: Normocephalic and atraumatic.  Mouth/Throat: Oropharynx is clear and moist.  Pt is drinking water which he brought with him.   Eyes: Conjunctivae are normal.  Neck: Normal range of motion.  Cardiovascular: Normal rate, regular rhythm, normal heart sounds and intact distal pulses.   Pulmonary/Chest: Effort normal and breath sounds normal. He has no wheezes.  Abdominal: Soft. Bowel sounds are normal. There is no tenderness.  Musculoskeletal: Normal range of motion.  Neurological: He is alert.  Skin: Skin is warm and dry.  Psychiatric: He has a normal mood and affect.    ED Course  Procedures (including critical care time) Labs Review Labs Reviewed  URINALYSIS, DIPSTICK ONLY    Imaging Review No results found.   EKG Interpretation None      MDM   Final diagnoses:  H/O dizziness  Normal physical exam    Patients labs  and/or radiological studies were viewed and considered during the medical decision making and disposition process. Pt was advised to make sure he is dressing cool, drinking plenty of fluids at work.  He does have the opportunity to work in a cooler area at his work,  Encouraged to consider this if sx persist when he returns to work [employer].  The patient appears reasonably screened and/or stabilized for discharge and I doubt  any other medical condition or other [facility] requiring further screening, evaluation, or treatment in the ED at this time prior to discharge.     Burgess AmorJulie Rohil Lesch, PA-C 09/22/13 1056

## 2014-09-24 ENCOUNTER — Emergency Department (HOSPITAL_COMMUNITY)
Admission: EM | Admit: 2014-09-24 | Discharge: 2014-09-24 | Disposition: A | Discharge status: Home / Self Care | Payer: BLUE CROSS/BLUE SHIELD | Attending: Emergency Medicine | Admitting: Emergency Medicine

## 2014-09-24 ENCOUNTER — Encounter (HOSPITAL_COMMUNITY): Payer: Self-pay

## 2014-09-24 DIAGNOSIS — Z720 Tobacco use: Secondary | ICD-10-CM | POA: Diagnosis not present

## 2014-09-24 DIAGNOSIS — R0602 Shortness of breath: Secondary | ICD-10-CM | POA: Diagnosis present

## 2014-09-24 DIAGNOSIS — J45901 Unspecified asthma with (acute) exacerbation: Secondary | ICD-10-CM | POA: Diagnosis not present

## 2014-09-24 MED ORDER — PREDNISONE 50 MG PO TABS
60.0000 mg | ORAL_TABLET | Freq: Once | ORAL | Status: AC
  Administered 2014-09-24: 60 mg via ORAL
  Filled 2014-09-24 (×2): qty 1

## 2014-09-24 MED ORDER — ALBUTEROL SULFATE (2.5 MG/3ML) 0.083% IN NEBU
5.0000 mg | INHALATION_SOLUTION | Freq: Once | RESPIRATORY_TRACT | Status: AC
  Administered 2014-09-24: 5 mg via RESPIRATORY_TRACT
  Filled 2014-09-24: qty 6

## 2014-09-24 MED ORDER — PREDNISONE 20 MG PO TABS: ORAL_TABLET | ORAL | Status: DC

## 2014-09-24 MED ORDER — ALBUTEROL SULFATE HFA 108 (90 BASE) MCG/ACT IN AERS
1.0000 | INHALATION_SPRAY | RESPIRATORY_TRACT | Status: DC | PRN
  Administered 2014-09-24: 2 via RESPIRATORY_TRACT
  Filled 2014-09-24: qty 6.7

## 2014-09-24 NOTE — ED Notes (Signed)
Patient states he noticed SOB yesterday. Patient states a history of asthma but has not had a flair up in 2 years. A&OX$

## 2014-09-24 NOTE — ED Notes (Signed)
Patient verbalizes understanding of discharge instructions, prescription medications, home care and follow up care if needed. Patient ambulatory out of department.

## 2014-09-24 NOTE — Discharge Instructions (Signed)

## 2014-09-24 NOTE — ED Provider Notes (Signed)
CSN: [id number]     Arrival date & time 09/24/14  0510 History   First MD Initiated Contact with Patient 09/24/14 [phone]     Chief Complaint  Patient presents with  . Shortness of Breath     (Consider location/radiation/quality/duration/timing/severity/associated sxs/prior Treatment) HPI Patient with prior history of asthma reports increasing shortness of breath starting at 8 PM last night. States he's had wheezing and dyspnea with exertion. Denies cough, fever or chills. He's had no lower extremity swelling or pain. Denies any sick contacts. Past Medical History  Diagnosis Date  . Asthma    History reviewed. No pertinent past surgical history. History reviewed. No pertinent family history. History  Substance Use Topics  . Smoking status: Current Every Day Smoker -- 0.50 packs/day    Types: Cigarettes  . Smokeless tobacco: Not on file  . Alcohol Use: Yes     Comment: occasionally    Review of Systems  Constitutional: Negative for fever and chills.  HENT: Negative for congestion and sore throat.   Respiratory: Positive for shortness of breath and wheezing. Negative for cough.   Cardiovascular: Negative for chest pain, palpitations and leg swelling.  Gastrointestinal: Negative for nausea, vomiting and abdominal pain.  Skin: Negative for rash and wound.  Neurological: Negative for dizziness, weakness, numbness and headaches.  All other systems reviewed and are negative.     Allergies  Review of patient's allergies indicates no known allergies.  Home Medications   Prior to Admission medications   Medication Sig Start Date End Date Taking? Authorizing Provider  predniSONE (DELTASONE) 20 MG tablet 3 tabs po day one, then 2 po daily x 4 days 09/24/14   Loren Raceravid Jeryl Umholtz, MD   BP 124/82 mmHg  Pulse 71  Temp(Src) 98.1 F (36.7 C) (Oral)  Resp 20  Ht 5\' 8"  (1.727 m)  Wt 150 lb (68.04 kg)  BMI 22.81 kg/m2  SpO2 100% Physical Exam  Constitutional: He is oriented to person,  place, and time. He appears well-developed and well-nourished. No distress.  HENT:  Head: Normocephalic and atraumatic.  Mouth/Throat: Oropharynx is clear and moist. No oropharyngeal exudate.  Eyes: EOM are normal. Pupils are equal, round, and reactive to light.  Neck: Normal range of motion. Neck supple.  Cardiovascular: Normal rate and regular rhythm.   Pulmonary/Chest: Effort normal. No respiratory distress. He has wheezes (diffuse expiratory wheezing). He has no rales.  No respiratory distress. Speaking in full sentences.  Abdominal: Soft. Bowel sounds are normal. He exhibits no distension and no mass. There is no tenderness. There is no rebound and no guarding.  Musculoskeletal: Normal range of motion. He exhibits no edema or tenderness.  No calf swelling or tenderness  Neurological: He is alert and oriented to person, place, and time.  Moves all extremities without deficit. Sensation is grossly intact. Ambulatory without difficulty.  Skin: Skin is warm and dry. No rash noted. No erythema.  Psychiatric: He has a normal mood and affect. His behavior is normal.  Nursing note and vitals reviewed.   ED Course  Procedures (including critical care time) Labs Review Labs Reviewed - No data to display  Imaging Review No results found.   EKG Interpretation None      MDM   Final diagnoses:  Asthma exacerbation    Wheezing is cleared. Patient states he is feeling much better. Given albuterol inhaler in the ED. Discharge home with short course of steroids. Return precautions given.    Loren Raceravid Grasiela Jonsson, MD 09/24/14 [phone]

## 2015-05-09 ENCOUNTER — Encounter (HOSPITAL_COMMUNITY): Payer: Self-pay | Admitting: *Deleted

## 2015-05-09 ENCOUNTER — Emergency Department (HOSPITAL_COMMUNITY)
Admission: EM | Admit: 2015-05-09 | Discharge: 2015-05-09 | Disposition: A | Discharge status: Home / Self Care | Payer: BLUE CROSS/BLUE SHIELD | Attending: Emergency Medicine | Admitting: Emergency Medicine

## 2015-05-09 DIAGNOSIS — E860 Dehydration: Secondary | ICD-10-CM | POA: Insufficient documentation

## 2015-05-09 DIAGNOSIS — F17210 Nicotine dependence, cigarettes, uncomplicated: Secondary | ICD-10-CM | POA: Insufficient documentation

## 2015-05-09 DIAGNOSIS — J45909 Unspecified asthma, uncomplicated: Secondary | ICD-10-CM | POA: Insufficient documentation

## 2015-05-09 DIAGNOSIS — G43809 Other migraine, not intractable, without status migrainosus: Secondary | ICD-10-CM | POA: Insufficient documentation

## 2015-05-09 MED ORDER — PROMETHAZINE HCL 25 MG PO TABS: 25.0000 mg | ORAL_TABLET | Freq: Four times a day (QID) | ORAL | Status: DC | PRN

## 2015-05-09 MED ORDER — METOCLOPRAMIDE HCL 5 MG/ML IJ SOLN
10.0000 mg | Freq: Once | INTRAMUSCULAR | Status: AC
  Administered 2015-05-09: 10 mg via INTRAVENOUS
  Filled 2015-05-09: qty 2

## 2015-05-09 MED ORDER — KETOROLAC TROMETHAMINE 30 MG/ML IJ SOLN
30.0000 mg | Freq: Once | INTRAMUSCULAR | Status: AC
  Administered 2015-05-09: 30 mg via INTRAVENOUS
  Filled 2015-05-09: qty 1

## 2015-05-09 MED ORDER — SODIUM CHLORIDE 0.9 % IV BOLUS (SEPSIS)
1000.0000 mL | Freq: Once | INTRAVENOUS | Status: AC
  Administered 2015-05-09: 1000 mL via INTRAVENOUS

## 2015-05-09 MED ORDER — DIPHENHYDRAMINE HCL 50 MG/ML IJ SOLN
25.0000 mg | Freq: Once | INTRAMUSCULAR | Status: AC
  Administered 2015-05-09: 25 mg via INTRAVENOUS
  Filled 2015-05-09: qty 1

## 2015-05-09 NOTE — Discharge Instructions (Signed)
Migraine Headache A migraine headache is very bad, throbbing pain on one or both sides of your head. Talk to your doctor about what things may bring on (trigger) your migraine headaches. HOME CARE  Only take medicines as told by your doctor.  Lie down in a dark, quiet room when you have a migraine.  Keep a journal to find out if certain things bring on migraine headaches. For example, write down:  What you eat and drink.  How much sleep you get.  Any change to your diet or medicines.  Lessen how much alcohol you drink.  Quit smoking if you smoke.  Get enough sleep.  Lessen any stress in your life.  Keep lights dim if bright lights bother you or make your migraines worse. GET HELP RIGHT AWAY IF:   Your migraine becomes really bad.  You have a fever.  You have a stiff neck.  You have trouble seeing.  Your muscles are weak, or you lose muscle control.  You lose your balance or have trouble walking.  You feel like you will pass out (faint), or you pass out.  You have really bad symptoms that are different than your first symptoms. MAKE SURE YOU:   Understand these instructions.  Will watch your condition.  Will get help right away if you are not doing well or get worse.   This information is not intended to replace advice given to you by your health care provider. Make sure you discuss any questions you have with your health care provider.   Document Released: 02/27/2008 Document Revised: 08/12/2011 Document Reviewed: 01/25/2013 Elsevier Interactive Patient Education 2016 [employer]   Medications for nausea.   Tylenol or ibuprofen for headache

## 2015-05-09 NOTE — ED Provider Notes (Signed)
CSN: [id number]     Arrival date & time 05/09/15  1355 History   First MD Initiated Contact with Patient 05/09/15 1432     Chief Complaint  Patient presents with  . Headache     (Consider location/radiation/quality/duration/timing/severity/associated sxs/prior Treatment) HPI....Marland Kitchen.Marland Kitchen. bitemporal headache for 3 days without neurological deficits.  ROS pos for nausea and vomiting.  He is normally healthy. No stiff neck or fever. He has tried nothing at home. Severity is mild to moderate.  Past Medical History  Diagnosis Date  . Asthma    History reviewed. No pertinent past surgical history. No family history on file. Social History  Substance Use Topics  . Smoking status: Current Every Day Smoker -- 0.50 packs/day    Types: Cigarettes  . Smokeless tobacco: None  . Alcohol Use: Yes     Comment: occasionally    Review of Systems  All other systems reviewed and are negative.     Allergies  Review of patient's allergies indicates no known allergies.  Home Medications   Prior to Admission medications   Medication Sig Start Date End Date Taking? Authorizing Provider  predniSONE (DELTASONE) 20 MG tablet 3 tabs po day one, then 2 po daily x 4 days Patient not taking: Reported on 05/09/2015 09/24/14   Loren Raceravid Yelverton, MD  promethazine (PHENERGAN) 25 MG tablet Take 1 tablet (25 mg total) by mouth every 6 (six) hours as needed. 05/09/15   Donnetta HutchingBrian Juliya Magill, MD   BP 155/88 mmHg  Pulse 94  Temp(Src) 97.6 F (36.4 C)  Resp 16  Ht 5\' 9"  (1.753 m)  Wt 135 lb (61.236 kg)  BMI 19.93 kg/m2  SpO2 100% Physical Exam  Constitutional: He is oriented to person, place, and time.  Slightly dehydrated  HENT:  Head: Normocephalic and atraumatic.  Eyes: Conjunctivae and EOM are normal. Pupils are equal, round, and reactive to light.  Neck: Normal range of motion. Neck supple.  Cardiovascular: Normal rate and regular rhythm.   Pulmonary/Chest: Effort normal and breath sounds normal.  Abdominal: Soft.  Bowel sounds are normal.  Musculoskeletal: Normal range of motion.  Neurological: He is alert and oriented to person, place, and time.  Skin: Skin is warm and dry.  Psychiatric: He has a normal mood and affect. His behavior is normal.  Nursing note and vitals reviewed.   ED Course  Procedures (including critical care time) Labs Review Labs Reviewed - No data to display  Imaging Review No results found. I have personally reviewed and evaluated these images and lab results as part of my medical decision-making.   EKG Interpretation None      MDM   Final diagnoses:  Other type of migraine    Patient feels better after IV fluids, IV Toradol, Reglan, Benadryl. Discharge medication Phenergan 25 mg. No neuro deficits    Donnetta HutchingBrian Naleigha Raimondi, MD 05/09/15 [phone]

## 2015-05-09 NOTE — ED Notes (Signed)
Pt comes in with headache starting Sunday with emesis starting last night. NAD noted.

## 2017-09-27 ENCOUNTER — Emergency Department (HOSPITAL_COMMUNITY)
Admission: EM | Admit: 2017-09-27 | Discharge: 2017-09-27 | Disposition: A | Discharge status: Home / Self Care | Payer: BLUE CROSS/BLUE SHIELD | Attending: Emergency Medicine | Admitting: Emergency Medicine

## 2017-09-27 ENCOUNTER — Encounter (HOSPITAL_COMMUNITY): Payer: Self-pay

## 2017-09-27 DIAGNOSIS — Z87891 Personal history of nicotine dependence: Secondary | ICD-10-CM | POA: Insufficient documentation

## 2017-09-27 DIAGNOSIS — J4521 Mild intermittent asthma with (acute) exacerbation: Secondary | ICD-10-CM | POA: Insufficient documentation

## 2017-09-27 DIAGNOSIS — Z79899 Other long term (current) drug therapy: Secondary | ICD-10-CM | POA: Insufficient documentation

## 2017-09-27 MED ORDER — ALBUTEROL SULFATE HFA 108 (90 BASE) MCG/ACT IN AERS
2.0000 | INHALATION_SPRAY | Freq: Once | RESPIRATORY_TRACT | Status: AC
  Administered 2017-09-27: 2 via RESPIRATORY_TRACT
  Filled 2017-09-27: qty 6.7

## 2017-09-27 MED ORDER — PREDNISONE 50 MG PO TABS
60.0000 mg | ORAL_TABLET | Freq: Once | ORAL | Status: AC
  Administered 2017-09-27: 60 mg via ORAL
  Filled 2017-09-27: qty 1

## 2017-09-27 MED ORDER — ALBUTEROL SULFATE (2.5 MG/3ML) 0.083% IN NEBU
2.5000 mg | INHALATION_SOLUTION | Freq: Once | RESPIRATORY_TRACT | Status: AC
  Administered 2017-09-27: 2.5 mg via RESPIRATORY_TRACT
  Filled 2017-09-27: qty 3

## 2017-09-27 MED ORDER — IPRATROPIUM-ALBUTEROL 0.5-2.5 (3) MG/3ML IN SOLN
3.0000 mL | Freq: Once | RESPIRATORY_TRACT | Status: AC
  Administered 2017-09-27: 3 mL via RESPIRATORY_TRACT
  Filled 2017-09-27: qty 3

## 2017-09-27 NOTE — ED Triage Notes (Signed)
Pt reports tightness in his chest, states feels like it is his asthma, has run out of his inhaler.

## 2017-09-27 NOTE — ED Provider Notes (Signed)
[facility] EMERGENCY DEPARTMENT Provider Note   CSN: [id number] Arrival date & time: 09/27/17  0142     History   Chief Complaint Chief Complaint  Patient presents with  . Asthma    HPI Fred Woodward is a 27 y.o. male.  The history is provided by the patient.  Asthma  This is a new problem. The current episode started 6 to 12 hours ago. The problem occurs constantly. The problem has been gradually improving. Associated symptoms include shortness of breath. Pertinent negatives include no chest pain and no abdominal pain. Nothing aggravates the symptoms. Nothing relieves the symptoms.   Patient reports onset of asthma symptoms of the past 6 to 12 hours.  He reports cough and wheezing.  No other acute complaints.  Denies hemoptysis.  No fever. Thinks it may be due to exposure to pollen recently Past Medical History:  Diagnosis Date  . Asthma     There are no active problems to display for this patient.   History reviewed. No pertinent surgical history.      Home Medications    Prior to Admission medications   Medication Sig Start Date End Date Taking? Authorizing Provider  albuterol (PROVENTIL HFA;VENTOLIN HFA) 108 (90 Base) MCG/ACT inhaler Inhale 2 puffs into the lungs every 4 (four) hours as needed for wheezing or shortness of breath.    [provider]    Family History Family History  Problem Relation Age of Onset  . Diabetes Mother     Social History Social History   Tobacco Use  . Smoking status: Former Smoker    Packs/day: 0.50    Types: Cigarettes  . Smokeless tobacco: Never Used  Substance Use Topics  . Alcohol use: Yes    Comment: occasionally  . Drug use: No     Allergies   Patient has no known allergies.   Review of Systems Review of Systems  Constitutional: Negative for fever.  Respiratory: Positive for shortness of breath.   Cardiovascular: Negative for chest pain.  Gastrointestinal: Negative for abdominal pain.  All  other systems reviewed and are negative.    Physical Exam Updated Vital Signs BP 108/78   Pulse 65   Temp 98 F (36.7 C) (Oral)   Resp 20   Ht 1.778 m ( )   Wt 63.5 kg (140 lb)   SpO2 98%   BMI 20.09 kg/m   Physical Exam  CONSTITUTIONAL: Well developed/well nourished, sleeping when I enter the room HEAD: Normocephalic/atraumatic EYES: EOMI ENMT: Mucous membranes moist NECK: supple no meningeal signs CV: S1/S2 noted, no murmurs/rubs/gallops noted LUNGS: Lungs are clear to auscultation bilaterally, no apparent distress ABDOMEN: soft, nontender NEURO: Pt is awake/alert/appropriate, moves all extremitiesx4.     EXTREMITIES: pulses normal/equal, full ROM no edema to lower extremities SKIN: warm, color normal PSYCH: no abnormalities of mood noted, alert and oriented to situation  ED Treatments / Results  Labs (all labs ordered are listed, but only abnormal results are displayed) Labs Reviewed - No data to display  EKG None  Radiology No results found.  Procedures Procedures   Medications Ordered in ED Medications  ipratropium-albuterol (DUONEB) 0.5-2.5 (3) MG/3ML nebulizer solution 3 mL (3 mLs Nebulization Given 09/27/17 0203)  albuterol (PROVENTIL) (2.5 MG/3ML) 0.083% nebulizer solution 2.5 mg (2.5 mg Nebulization Given 09/27/17 0202)  predniSONE (DELTASONE) tablet 60 mg (60 mg Oral Given 09/27/17 0411)  albuterol (PROVENTIL HFA;VENTOLIN HFA) 108 (90 Base) MCG/ACT inhaler 2 puff (2 puffs Inhalation Given 09/27/17 0530)  Initial Impression / Assessment and Plan / ED Course  I have reviewed the triage vital signs and the nursing notes.      On my evaluation all of his wheezing has resolved.  He denies any new complaints.  He reports similar prior episodes of asthma.  Reports his last episode was over 6 months ago.  He has run out of his inhaler.  Inhaler was given at time of discharge.  I do not feel he needs a burst of steroids since his wheezing is  completely resolved and he is in no distress  Final Clinical Impressions(s) / ED Diagnoses   Final diagnoses:  Mild intermittent asthma with exacerbation    ED Discharge Orders    None       Zadie Rhine, MD 09/27/17 [phone]

## 2018-06-16 ENCOUNTER — Encounter (HOSPITAL_COMMUNITY): Payer: Self-pay

## 2018-06-16 ENCOUNTER — Emergency Department (HOSPITAL_COMMUNITY)
Admission: EM | Admit: 2018-06-16 | Discharge: 2018-06-17 | Disposition: A | Discharge status: Home / Self Care | Payer: Self-pay | Attending: Emergency Medicine | Admitting: Emergency Medicine

## 2018-06-16 DIAGNOSIS — R509 Fever, unspecified: Secondary | ICD-10-CM | POA: Insufficient documentation

## 2018-06-16 DIAGNOSIS — Z5321 Procedure and treatment not carried out due to patient leaving prior to being seen by health care provider: Secondary | ICD-10-CM | POA: Insufficient documentation

## 2018-06-16 DIAGNOSIS — R109 Unspecified abdominal pain: Secondary | ICD-10-CM | POA: Insufficient documentation

## 2018-06-16 DIAGNOSIS — R05 Cough: Secondary | ICD-10-CM | POA: Insufficient documentation

## 2018-06-16 DIAGNOSIS — R51 Headache: Secondary | ICD-10-CM | POA: Insufficient documentation

## 2018-06-16 MED ORDER — ACETAMINOPHEN 325 MG PO TABS
650.0000 mg | ORAL_TABLET | Freq: Once | ORAL | Status: AC | PRN
  Administered 2018-06-16: 650 mg via ORAL
  Filled 2018-06-16: qty 2

## 2018-06-16 NOTE — ED Triage Notes (Signed)
Pt states that for the past two days he has been having fever, chills, cough, headache, abd pain and nausea.

## 2018-06-17 ENCOUNTER — Other Ambulatory Visit: Payer: Self-pay

## 2018-06-17 ENCOUNTER — Emergency Department (HOSPITAL_BASED_OUTPATIENT_CLINIC_OR_DEPARTMENT_OTHER)
Admission: EM | Admit: 2018-06-17 | Discharge: 2018-06-17 | Disposition: A | Discharge status: Home / Self Care | Payer: Self-pay | Attending: Emergency Medicine | Admitting: Emergency Medicine

## 2018-06-17 ENCOUNTER — Encounter (HOSPITAL_BASED_OUTPATIENT_CLINIC_OR_DEPARTMENT_OTHER): Payer: Self-pay

## 2018-06-17 ENCOUNTER — Emergency Department (HOSPITAL_BASED_OUTPATIENT_CLINIC_OR_DEPARTMENT_OTHER): Payer: Self-pay

## 2018-06-17 DIAGNOSIS — J111 Influenza due to unidentified influenza virus with other respiratory manifestations: Secondary | ICD-10-CM | POA: Insufficient documentation

## 2018-06-17 DIAGNOSIS — J45909 Unspecified asthma, uncomplicated: Secondary | ICD-10-CM | POA: Insufficient documentation

## 2018-06-17 DIAGNOSIS — Z87891 Personal history of nicotine dependence: Secondary | ICD-10-CM | POA: Insufficient documentation

## 2018-06-17 DIAGNOSIS — R69 Illness, unspecified: Secondary | ICD-10-CM

## 2018-06-17 MED ORDER — ACETAMINOPHEN 500 MG PO TABS
1000.0000 mg | ORAL_TABLET | Freq: Once | ORAL | Status: AC
  Administered 2018-06-17: 1000 mg via ORAL
  Filled 2018-06-17: qty 2

## 2018-06-17 MED ORDER — ALBUTEROL SULFATE HFA 108 (90 BASE) MCG/ACT IN AERS
2.0000 | INHALATION_SPRAY | Freq: Once | RESPIRATORY_TRACT | Status: AC
  Administered 2018-06-17: 2 via RESPIRATORY_TRACT
  Filled 2018-06-17: qty 6.7

## 2018-06-17 MED ORDER — IBUPROFEN 800 MG PO TABS
800.0000 mg | ORAL_TABLET | Freq: Once | ORAL | Status: AC
  Administered 2018-06-17: 800 mg via ORAL
  Filled 2018-06-17: qty 1

## 2018-06-17 NOTE — ED Notes (Signed)
Pt states he does not want to wait any longer.

## 2018-06-17 NOTE — ED Notes (Signed)
ED Provider at bedside. 

## 2018-06-17 NOTE — ED Notes (Addendum)
Pt states he is having chills and sweating x 3 days. He has a cough, fever, sore throat and generalized body aches. Pt denies exposure to anyone sick. Pt denies taking medications to treat symptoms.

## 2018-06-17 NOTE — ED Triage Notes (Signed)
Pt has fever, body aches, and cough for the last two days, did not take any medication prior to arrival

## 2018-06-17 NOTE — ED Provider Notes (Signed)
[facility] EMERGENCY DEPARTMENT Provider Note   CSN: [id number] Arrival date & time: 06/17/18  2141     History   Chief Complaint Chief Complaint  Patient presents with  . Flu-Like Symptoms    HPI BRANDN SLIWINSKI is a 28 y.o. male.  HPI Patient is a 28 year old male presents the emergency department with complaints of fever and chills over the past 72 hours.  Mild headache.  No neck pain or stiffness.  Cough fever sore throat generalized myalgias.  No recent sick contacts.  No medications prior to arrival.  Cough is nonproductive.  No vomiting or diarrhea.  Denies nausea.  No chest pain.  No abdominal pain.  Family reports decreased oral intake.   Past Medical History:  Diagnosis Date  . Asthma     There are no active problems to display for this patient.   History reviewed. No pertinent surgical history.      Home Medications    Prior to Admission medications   Medication Sig Start Date End Date Taking? Authorizing Provider  albuterol (PROVENTIL HFA;VENTOLIN HFA) 108 (90 Base) MCG/ACT inhaler Inhale 2 puffs into the lungs every 4 (four) hours as needed for wheezing or shortness of breath.    [provider]    Family History Family History  Problem Relation Age of Onset  . Diabetes Mother     Social History Social History   Tobacco Use  . Smoking status: Former Smoker    Packs/day: 0.50    Types: Cigarettes  . Smokeless tobacco: Never Used  Substance Use Topics  . Alcohol use: Yes    Comment: occasionally  . Drug use: No     Allergies   Patient has no known allergies.   Review of Systems Review of Systems  All other systems reviewed and are negative.    Physical Exam Updated Vital Signs BP 123/89 (BP Location: Left Arm)   Pulse 87   Temp (!) 100.6 F (38.1 C) (Oral)   Resp 18   Ht 5\' 10"  (1.778 m)   Wt 65.8 kg   SpO2 100%   BMI 20.81 kg/m   Physical Exam Vitals signs and nursing note reviewed.    Constitutional:      Appearance: He is well-developed.  HENT:     Head: Normocephalic and atraumatic.  Neck:     Musculoskeletal: Normal range of motion.  Cardiovascular:     Rate and Rhythm: Normal rate and regular rhythm.     Heart sounds: Normal heart sounds.  Pulmonary:     Effort: Pulmonary effort is normal. No respiratory distress.     Breath sounds: Normal breath sounds.  Abdominal:     General: There is no distension.     Palpations: Abdomen is soft.     Tenderness: There is no abdominal tenderness.  Musculoskeletal: Normal range of motion.  Skin:    General: Skin is warm and dry.  Neurological:     Mental Status: He is alert and oriented to person, place, and time.  Psychiatric:        Judgment: Judgment normal.      ED Treatments / Results  Labs (all labs ordered are listed, but only abnormal results are displayed) Labs Reviewed - No data to display  EKG None  Radiology Dg Chest 2 View  Result Date: 06/17/2018 CLINICAL DATA:  Fever, cough, chills, abdominal pain, and headache for 3 days. EXAM: CHEST - 2 VIEW COMPARISON:  03/05/2007 FINDINGS: Mild hyperinflation. The heart  size and mediastinal contours are within normal limits. Both lungs are clear. The visualized skeletal structures are unremarkable. IMPRESSION: No active cardiopulmonary disease. Electronically Signed   By: Burman Nieves M.D.   On: 06/17/2018 22:28    Procedures Procedures (including critical care time)  Medications Ordered in ED Medications  albuterol (PROVENTIL HFA;VENTOLIN HFA) 108 (90 Base) MCG/ACT inhaler 2 puff (has no administration in time range)  acetaminophen (TYLENOL) tablet 1,000 mg (1,000 mg Oral Given 06/17/18 2154)  ibuprofen (ADVIL,MOTRIN) tablet 800 mg (800 mg Oral Given 06/17/18 2154)     Initial Impression / Assessment and Plan / ED Course  I have reviewed the triage vital signs and the nursing notes.  Pertinent labs & imaging results that were available during my  care of the patient were reviewed by me and considered in my medical decision making (see chart for details).     Influenza-like illness.  Chest x-ray clear.  Vital signs stable.  Fever reduced in the emergency department.  Albuterol to help with cough.  Recommended ibuprofen and Tylenol and ongoing oral hydration at home.  Patient understands to return to the ER for new or worsening symptoms.   Final Clinical Impressions(s) / ED Diagnoses   Final diagnoses:  Influenza-like illness    ED Discharge Orders    None       Azalia Bilis, MD 06/17/18 2257

## 2019-02-10 ENCOUNTER — Emergency Department (HOSPITAL_COMMUNITY): Payer: Self-pay

## 2019-02-10 ENCOUNTER — Other Ambulatory Visit: Payer: Self-pay

## 2019-02-10 ENCOUNTER — Emergency Department (HOSPITAL_COMMUNITY)
Admission: EM | Admit: 2019-02-10 | Discharge: 2019-02-10 | Disposition: A | Discharge status: Home / Self Care | Payer: Self-pay | Attending: Emergency Medicine | Admitting: Emergency Medicine

## 2019-02-10 ENCOUNTER — Encounter (HOSPITAL_COMMUNITY): Payer: Self-pay | Admitting: Emergency Medicine

## 2019-02-10 DIAGNOSIS — J45909 Unspecified asthma, uncomplicated: Secondary | ICD-10-CM | POA: Insufficient documentation

## 2019-02-10 DIAGNOSIS — R030 Elevated blood-pressure reading, without diagnosis of hypertension: Secondary | ICD-10-CM | POA: Insufficient documentation

## 2019-02-10 DIAGNOSIS — F17210 Nicotine dependence, cigarettes, uncomplicated: Secondary | ICD-10-CM | POA: Insufficient documentation

## 2019-02-10 DIAGNOSIS — M25531 Pain in right wrist: Secondary | ICD-10-CM | POA: Insufficient documentation

## 2019-02-10 MED ORDER — NAPROXEN 500 MG PO TABS: 500.0000 mg | ORAL_TABLET | Freq: Two times a day (BID) | ORAL | 0 refills | Status: AC

## 2019-02-10 NOTE — ED Triage Notes (Signed)
Pt c/o right wrist pain x one month.

## 2019-02-10 NOTE — Discharge Instructions (Signed)
Your xrays are negative for acute bony injury but your exam suggests an irritation/inflammation of your tendons.  Wear the splint as discussed to help rest your wrist joint.  A heating pad can also be helpful - 20 minutes several times daily (or warm water soak).  Your blood pressure is elevated tonight and may be from the stress of your visit here but you should not ignore this and have it rechecked within the next week. Call your doctor for a blood pressure recheck visit.

## 2019-02-10 NOTE — ED Notes (Signed)
Wrist splint applied to patient's right wrist.

## 2019-02-11 NOTE — ED Provider Notes (Signed)
[facility] EMERGENCY DEPARTMENT Provider Note   CSN: [id number] Arrival date & time: 02/10/19  2001     History   Chief Complaint Chief Complaint  Patient presents with  . Wrist Pain    HPI Fred Woodward is a 28 y.o. male, right handed, presenting with a one month history of right wrist pain after a direct blow between the wall and a piece of furniture he was helping move.  He reports improved but not resolved pain centered over the lateral dorsal wrist and is worsened with flexion and extension and twisting the joint.  He denies weakness or numbness, denies hand or finger pain.  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  He has tried rest and ice with some gradual improvement.     HPI  Past Medical History:  Diagnosis Date  . Asthma     There are no active problems to display for this patient.   History reviewed. No pertinent surgical history.      Home Medications    Prior to Admission medications   Medication Sig Start Date End Date Taking? Authorizing Provider  albuterol (PROVENTIL HFA;VENTOLIN HFA) 108 (90 Base) MCG/ACT inhaler Inhale 2 puffs into the lungs every 4 (four) hours as needed for wheezing or shortness of breath.    [provider]  naproxen (NAPROSYN) 500 MG tablet Take 1 tablet (500 mg total) by mouth 2 (two) times daily. 02/10/19   Evalee Jefferson, PA-C    Family History Family History  Problem Relation Age of Onset  . Diabetes Mother     Social History Social History   Tobacco Use  . Smoking status: Current Every Day Smoker    Packs/day: 0.50    Types: Cigarettes  . Smokeless tobacco: Never Used  Substance Use Topics  . Alcohol use: Yes    Comment: occasionally  . Drug use: No     Allergies   Patient has no known allergies.   Review of Systems Review of Systems  Constitutional: Negative for fever.  Musculoskeletal: Positive for arthralgias. Negative for joint swelling and myalgias.   Neurological: Negative for weakness and numbness.     Physical Exam Updated Vital Signs BP 139/89   Pulse 79   Temp 98.2 F (36.8 C)   Resp 18   Ht 5\' 10"  (1.778 m)   Wt 63.5 kg   SpO2 100%   BMI 20.09 kg/m   Physical Exam Constitutional:      Appearance: He is well-developed.  HENT:     Head: Atraumatic.  Neck:     Musculoskeletal: Normal range of motion.  Cardiovascular:     Comments: Pulses equal bilaterally Musculoskeletal:        General: Tenderness present. No swelling or deformity.     Right wrist: He exhibits bony tenderness. He exhibits normal range of motion, no swelling, no effusion, no crepitus and no deformity.     Comments: ttp right dorsolateral ulnar styloid. No deformity. FROM but with localized pain at this site with resisted flex/ext.  No forearm pain, equal grip strength. Less than 2 sec distal cap refill.  Skin:    General: Skin is warm and dry.  Neurological:     Mental Status: He is alert.     Sensory: No sensory deficit.     Deep Tendon Reflexes: Reflexes normal.      ED Treatments / Results  Labs (all labs ordered are listed, but only abnormal results are displayed) Labs  Reviewed - No data to display  EKG None  Radiology Dg Wrist Complete Right  Result Date: 02/10/2019 CLINICAL DATA:  Crush injury EXAM: RIGHT WRIST - COMPLETE 3+ VIEW COMPARISON:  None. FINDINGS: There is no evidence of fracture or dislocation. There is no evidence of arthropathy or other focal bone abnormality. Soft tissues are unremarkable. IMPRESSION: Negative. Electronically Signed   By: Jonna ClarkBindu  Avutu M.D.   On: 02/10/2019 20:57    Procedures Procedures (including critical care time)  Medications Ordered in ED Medications - No data to display   Initial Impression / Assessment and Plan / ED Course  I have reviewed the triage vital signs and the nursing notes.  Pertinent labs & imaging results that were available during my care of the patient were reviewed by me  and considered in my medical decision making (see chart for details).        Imaging reviewed and discussed with pt.  Suspected tendonitis. velcro wrist splint for resting the joint, nsaids. Discussed role of ice/heat tx.  Referral to ortho for persistent sx management.  Pt bp elevated at presentation, improved but still elevated at dc. Discussed. Advised recheck bp with pcp, pt agreeable.   Final Clinical Impressions(s) / ED Diagnoses   Final diagnoses:  Acute pain of right wrist  Elevated blood pressure reading    ED Discharge Orders         Ordered    naproxen (NAPROSYN) 500 MG tablet  2 times daily     02/10/19 2104           Burgess Amordol, Danise Dehne, PA-C 02/11/19 1154    Bethann BerkshireZammit, Joseph, MD 02/12/19 1109

## 2019-11-23 ENCOUNTER — Encounter (HOSPITAL_BASED_OUTPATIENT_CLINIC_OR_DEPARTMENT_OTHER): Payer: Self-pay | Admitting: *Deleted

## 2019-11-23 ENCOUNTER — Emergency Department (HOSPITAL_BASED_OUTPATIENT_CLINIC_OR_DEPARTMENT_OTHER)
Admission: EM | Admit: 2019-11-23 | Discharge: 2019-11-23 | Disposition: A | Discharge status: Home / Self Care | Payer: Self-pay | Attending: Emergency Medicine | Admitting: Emergency Medicine

## 2019-11-23 ENCOUNTER — Other Ambulatory Visit: Payer: Self-pay

## 2019-11-23 DIAGNOSIS — J4521 Mild intermittent asthma with (acute) exacerbation: Secondary | ICD-10-CM | POA: Insufficient documentation

## 2019-11-23 DIAGNOSIS — R0789 Other chest pain: Secondary | ICD-10-CM | POA: Insufficient documentation

## 2019-11-23 DIAGNOSIS — J45901 Unspecified asthma with (acute) exacerbation: Secondary | ICD-10-CM

## 2019-11-23 DIAGNOSIS — F17210 Nicotine dependence, cigarettes, uncomplicated: Secondary | ICD-10-CM | POA: Insufficient documentation

## 2019-11-23 MED ORDER — ALBUTEROL SULFATE HFA 108 (90 BASE) MCG/ACT IN AERS: 8.0000 | INHALATION_SPRAY | Freq: Once | RESPIRATORY_TRACT | Status: AC

## 2019-11-23 MED ORDER — ALBUTEROL SULFATE HFA 108 (90 BASE) MCG/ACT IN AERS
INHALATION_SPRAY | RESPIRATORY_TRACT | Status: AC
  Administered 2019-11-23: 8 via RESPIRATORY_TRACT
  Filled 2019-11-23: qty 6.7

## 2019-11-23 NOTE — ED Provider Notes (Signed)
[facility] EMERGENCY DEPARTMENT Provider Note   CSN: [id number] Arrival date & time: 11/23/19  1304     History Chief Complaint  Patient presents with  . Asthma    Fred Woodward is a 29 y.o. male.  HPI   29 year old male with a history of asthma presenting the emergency department today for evaluation of shortness of breath, chest tightness and cough that started earlier today.  Patient states he was at work when someone dropped a pallet next to him and a bunch of dust flew into the air around him.  States he thought that he would be okay however it started raining and rain usually makes his asthma flareup and after this his symptoms got somewhat worse.  He denies any significant coughing, chest pain, fevers.  He received albuterol prior to my evaluation and states he now feels back to baseline.  Past Medical History:  Diagnosis Date  . Asthma     There are no problems to display for this patient.   History reviewed. No pertinent surgical history.     Family History  Problem Relation Age of Onset  . Diabetes Mother     Social History   Tobacco Use  . Smoking status: Current Every Day Smoker    Packs/day: 0.50    Types: Cigarettes  . Smokeless tobacco: Never Used  Substance Use Topics  . Alcohol use: Yes    Comment: occasionally  . Drug use: No    Home Medications Prior to Admission medications   Medication Sig Start Date End Date Taking? Authorizing Provider  albuterol (PROVENTIL HFA;VENTOLIN HFA) 108 (90 Base) MCG/ACT inhaler Inhale 2 puffs into the lungs every 4 (four) hours as needed for wheezing or shortness of breath.    [provider]  naproxen (NAPROSYN) 500 MG tablet Take 1 tablet (500 mg total) by mouth 2 (two) times daily. 02/10/19   Burgess Amor, PA-C    Allergies    Patient has no known allergies.  Review of Systems   Review of Systems  Constitutional: Negative for fever.  Respiratory: Positive for shortness of breath and  wheezing. Negative for cough.   Cardiovascular: Negative for chest pain.       Chest tightness  Gastrointestinal: Negative for abdominal pain and diarrhea.  Musculoskeletal: Negative for back pain.  Neurological: Negative for headaches.    Physical Exam Updated Vital Signs BP (!) 134/102 (BP Location: Right Arm)   Pulse 84   Resp 16   SpO2 96%   Physical Exam Vitals and nursing note reviewed.  Constitutional:      Appearance: He is well-developed.  HENT:     Head: Normocephalic and atraumatic.  Eyes:     Conjunctiva/sclera: Conjunctivae normal.  Cardiovascular:     Rate and Rhythm: Normal rate and regular rhythm.     Pulses: Normal pulses.     Heart sounds: Normal heart sounds. No murmur heard.   Pulmonary:     Effort: Pulmonary effort is normal. No respiratory distress.     Breath sounds: Normal breath sounds. No wheezing, rhonchi or rales.  Abdominal:     Palpations: Abdomen is soft.     Tenderness: There is no abdominal tenderness.  Musculoskeletal:     Cervical back: Neck supple.  Skin:    General: Skin is warm and dry.  Neurological:     Mental Status: He is alert.     ED Results / Procedures / Treatments   Labs (all labs ordered are  listed, but only abnormal results are displayed) Labs Reviewed - No data to display  EKG None  Radiology No results found.  Procedures Procedures (including critical care time)  Medications Ordered in ED Medications  albuterol (VENTOLIN HFA) 108 (90 Base) MCG/ACT inhaler 8 puff (8 puffs Inhalation Given 11/23/19 1324)    ED Course  I have reviewed the triage vital signs and the nursing notes.  Pertinent labs & imaging results that were available during my care of the patient were reviewed by me and considered in my medical decision making (see chart for details).    MDM Rules/Calculators/A&P                          29 year old male presenting for evaluation of asthma flare that started after breathing and a  bunch of dust and having it rain prior to arrival.  He received albuterol prior to my evaluation and when I evaluated him he stated he felt back to baseline.  His lungs are clear to auscultation bilaterally.  Heart with regular rate and rhythm.  Satting at 99% on room air during my eval.  Given minimal symptoms feel he is appropriate for albuterol only and do not think that steroids are indicated given wheezing completely resolved after 2 puffs of albuterol.  Have advised him to follow-up with PCP and return to ED for new or worsening symptoms.  He voices understanding letter is to return for all questions answered.  Patient stable for discharge.  Final Clinical Impression(s) / ED Diagnoses Final diagnoses:  Mild asthma with exacerbation, unspecified whether persistent    Rx / DC Orders ED Discharge Orders    None       Rodney Booze, PA-C 11/23/19 1409    Lucrezia Starch, MD 11/24/19 1006

## 2019-11-23 NOTE — Discharge Instructions (Signed)
Please take 2 puffs of the albuterol inhaler every 4-6 hours as needed for shortness of breath or wheezing  Please follow up with your primary care provider within 5-7 days for re-evaluation of your symptoms. If you do not have a primary care provider, information for a healthcare clinic has been provided for you to make arrangements for follow up care. Please return to the emergency department for any new or worsening symptoms.

## 2019-11-23 NOTE — ED Triage Notes (Signed)
Pt c/o SOB x 1 day , HX asthma

## 2023-10-18 DIAGNOSIS — R062 Wheezing: Secondary | ICD-10-CM | POA: Diagnosis not present

## 2023-10-18 DIAGNOSIS — R069 Unspecified abnormalities of breathing: Secondary | ICD-10-CM | POA: Diagnosis not present

## 2023-10-18 DIAGNOSIS — I10 Essential (primary) hypertension: Secondary | ICD-10-CM | POA: Diagnosis not present

## 2024-06-29 ENCOUNTER — Other Ambulatory Visit: Payer: Self-pay

## 2024-06-29 ENCOUNTER — Encounter (HOSPITAL_COMMUNITY): Payer: Self-pay | Admitting: *Deleted

## 2024-06-29 ENCOUNTER — Emergency Department (HOSPITAL_COMMUNITY)
Admission: EM | Admit: 2024-06-29 | Discharge: 2024-06-29 | Disposition: A | Discharge status: Home / Self Care | Attending: Emergency Medicine | Admitting: Emergency Medicine

## 2024-06-29 DIAGNOSIS — R7401 Elevation of levels of liver transaminase levels: Secondary | ICD-10-CM | POA: Insufficient documentation

## 2024-06-29 DIAGNOSIS — R112 Nausea with vomiting, unspecified: Secondary | ICD-10-CM | POA: Diagnosis present

## 2024-06-29 LAB — COMPREHENSIVE METABOLIC PANEL WITH GFR
ALT: 48 U/L — ABNORMAL HIGH (ref 0–44)
AST: 32 U/L (ref 15–41)
Albumin: 5.3 g/dL — ABNORMAL HIGH (ref 3.5–5.0)
Alkaline Phosphatase: 66 U/L (ref 38–126)
Anion gap: 14 (ref 5–15)
BUN: 23 mg/dL — ABNORMAL HIGH (ref 6–20)
CO2: 29 mmol/L (ref 22–32)
Calcium: 10.2 mg/dL (ref 8.9–10.3)
Chloride: 96 mmol/L — ABNORMAL LOW (ref 98–111)
Creatinine, Ser: 1.19 mg/dL (ref 0.61–1.24)
GFR, Estimated: >60 mL/min
Glucose, Bld: 101 mg/dL — ABNORMAL HIGH (ref 70–99)
Potassium: 4.2 mmol/L (ref 3.5–5.1)
Sodium: 140 mmol/L (ref 135–145)
Total Bilirubin: 0.9 mg/dL (ref 0.0–1.2)
Total Protein: 8.4 g/dL — ABNORMAL HIGH (ref 6.5–8.1)

## 2024-06-29 LAB — CBC
HCT: 49.7 % (ref 39.0–52.0)
Hemoglobin: 15.9 g/dL (ref 13.0–17.0)
MCH: 28 pg (ref 26.0–34.0)
MCHC: 32 g/dL (ref 30.0–36.0)
MCV: 87.5 fL (ref 80.0–100.0)
Platelets: 135 10*3/uL — ABNORMAL LOW (ref 150–400)
RBC: 5.68 MIL/uL (ref 4.22–5.81)
RDW: 11.9 % (ref 11.5–15.5)
WBC: 6 10*3/uL (ref 4.0–10.5)
nRBC: 0 % (ref 0.0–0.2)

## 2024-06-29 LAB — LIPASE, BLOOD: Lipase: 50 U/L (ref 11–51)

## 2024-06-29 MED ORDER — SODIUM CHLORIDE 0.9 % IV BOLUS
1000.0000 mL | Freq: Once | INTRAVENOUS | Status: AC
  Administered 2024-06-29: 1000 mL via INTRAVENOUS

## 2024-06-29 MED ORDER — ONDANSETRON HCL 4 MG/2ML IJ SOLN
4.0000 mg | Freq: Once | INTRAMUSCULAR | Status: AC
  Administered 2024-06-29: 4 mg via INTRAVENOUS
  Filled 2024-06-29: qty 2

## 2024-06-29 MED ORDER — FAMOTIDINE 20 MG PO TABS
20.0000 mg | ORAL_TABLET | Freq: Once | ORAL | Status: AC
  Administered 2024-06-29: 20 mg via ORAL
  Filled 2024-06-29: qty 1

## 2024-06-29 MED ORDER — FAMOTIDINE 20 MG PO TABS: 20.0000 mg | ORAL_TABLET | Freq: Two times a day (BID) | ORAL | 0 refills | Status: AC

## 2024-06-29 MED ORDER — ONDANSETRON HCL 4 MG PO TABS: 4.0000 mg | ORAL_TABLET | Freq: Four times a day (QID) | ORAL | 0 refills | Status: AC | PRN

## 2024-06-29 NOTE — ED Triage Notes (Signed)
 Pt with emesis since Saturday, denies any sick contacts.

## 2024-06-29 NOTE — ED Provider Notes (Signed)
 " [place] EMERGENCY DEPARTMENT AT [facility] Provider Note   CSN: [id number] Arrival date & time: 06/29/24  1145     Patient presents with: Emesis   Fred Woodward is a 34 y.o. male with no significant past medical history presenting for evaluation of nausea and vomiting which started 3 days ago.  He endorses 4-5 episodes of vomiting per day with brief epigastric discomfort during emesis after which he is symptom free regarding abdominal pain but does continue to be nauseated.  He has had no fevers or chills, denies cough, chest pain, URI type symptoms, no abdominal distention, no diarrhea.  He works for a [employer], no known exposures to others with similar symptoms.  He has had no treatment prior to arrival.  He has had small sips of Sprite but anything more substantial results and vomiting.   The history is provided by the patient.       Prior to Admission medications  Medication Sig Start Date End Date Taking? Authorizing Provider  famotidine  (PEPCID ) 20 MG tablet Take 1 tablet (20 mg total) by mouth 2 (two) times daily. 06/29/24  Yes Armin Yerger, PA-C  ondansetron  (ZOFRAN ) 4 MG tablet Take 1 tablet (4 mg total) by mouth every 6 (six) hours as needed for nausea or vomiting. 06/29/24  Yes Jaonna Word, Mliss, PA-C  albuterol  (PROVENTIL  HFA;VENTOLIN  HFA) 108 (90 Base) MCG/ACT inhaler Inhale 2 puffs into the lungs every 4 (four) hours as needed for wheezing or shortness of breath.    [provider]  naproxen  (NAPROSYN ) 500 MG tablet Take 1 tablet (500 mg total) by mouth 2 (two) times daily. 02/10/19   Erinne Gillentine, PA-C    Allergies: Patient has no known allergies.    Review of Systems  Constitutional:  Negative for chills and fever.  HENT:  Negative for congestion and sore throat.   Eyes: Negative.   Respiratory:  Negative for chest tightness and shortness of breath.   Cardiovascular:  Negative for chest pain.  Gastrointestinal:  Positive for nausea  and vomiting. Negative for abdominal distention, abdominal pain and diarrhea.  Genitourinary: Negative.  Negative for dysuria.  Musculoskeletal:  Negative for arthralgias, joint swelling and neck pain.  Skin: Negative.  Negative for rash and wound.  Neurological:  Negative for dizziness, weakness, light-headedness, numbness and headaches.  Psychiatric/Behavioral: Negative.    All other systems reviewed and are negative.   Updated Vital Signs BP 116/78   Pulse 70   Temp 98.2 F (36.8 C)   Resp 18   Ht 5' 9 (1.753 m)   Wt 59 kg   SpO2 99%   BMI 19.20 kg/m   Physical Exam Vitals and nursing note reviewed.  Constitutional:      Appearance: He is well-developed.  HENT:     Head: Normocephalic and atraumatic.     Mouth/Throat:     Mouth: Mucous membranes are moist.     Pharynx: No posterior oropharyngeal erythema.  Eyes:     Conjunctiva/sclera: Conjunctivae normal.  Cardiovascular:     Rate and Rhythm: Normal rate and regular rhythm.     Heart sounds: Normal heart sounds.  Pulmonary:     Effort: Pulmonary effort is normal.     Breath sounds: Normal breath sounds. No wheezing.  Abdominal:     General: Bowel sounds are normal. There is no distension.     Palpations: Abdomen is soft.     Tenderness: There is no abdominal tenderness. There is no  guarding.  Musculoskeletal:        General: Normal range of motion.     Cervical back: Normal range of motion.  Skin:    General: Skin is warm and dry.  Neurological:     Mental Status: He is alert and oriented to person, place, and time.     (all labs ordered are listed, but only abnormal results are displayed) Labs Reviewed  COMPREHENSIVE METABOLIC PANEL WITH GFR - Abnormal; Notable for the following components:      Result Value   Chloride 96 (*)    Glucose, Bld 101 (*)    BUN 23 (*)    Total Protein 8.4 (*)    Albumin 5.3 (*)    ALT 48 (*)    All other components within normal limits  CBC - Abnormal; Notable for the  following components:   Platelets 135 (*)    All other components within normal limits  LIPASE, BLOOD    EKG: None  Radiology: No results found.   Procedures   Medications Ordered in the ED  sodium chloride  0.9 % bolus 1,000 mL (0 mLs Intravenous Stopped 06/29/24 1742)  ondansetron  (ZOFRAN ) injection 4 mg (4 mg Intravenous Given 06/29/24 1553)  famotidine  (PEPCID ) tablet 20 mg (20 mg Oral Given 06/29/24 1759)                                    Medical Decision Making Patient presenting with nausea and vomiting, no persistent abdominal pain, no distention, no fevers or chills no diarrhea.  Differential diagnosis including viral gastritis, biliary colic, GERD, constipation, bowel obstruction, although exam and history does not suggest these last 3 possibilities.  Labs have been obtained and are reassuring as outlined below.  He was given IV fluids and Zofran  after which he was able to tolerate p.o. intake.  He was feeling better at time of discharge.  Suspect symptoms are simply from gastritis, he was prescribed Zofran  and Pepcid , we discussed home treatment including foods, fluid intake..  Follow-up anticipated.  Amount and/or Complexity of Data Reviewed Labs: ordered.    Details: Labs reviewed including c-Met, lipase and CBC, no significant findings.  He does have a borderline elevated ALT but normal AST, bilirubin and alk phos.  Normal WBC count at 6.0  Risk Prescription drug management.        Final diagnoses:  Nausea and vomiting, unspecified vomiting type    ED Discharge Orders          Ordered    famotidine  (PEPCID ) 20 MG tablet  2 times daily        06/29/24 1748    ondansetron  (ZOFRAN ) 4 MG tablet  Every 6 hours PRN        06/29/24 1748               Patriciann Becht, PA-C 06/29/24 1845  "

## 2024-06-29 NOTE — ED Notes (Signed)
 Pt given apple sauce, 1 pack of saltine crackers, and one ginger ale with ice.

## 2024-06-29 NOTE — Discharge Instructions (Signed)
 Your lab test today are reassuring.  I am prescribing you some nausea medicine along with an antacid medicine to help resolve any inflammation or irritation of the lining of your stomach.  I do recommend rest and make sure you are drinking plenty of fluids to avoid dehydration.  Plan to follow-up with your primary MD if your symptoms are not improving over this week with this treatment plan.
